# Patient Record
Sex: Female | Born: 1940 | Race: White | Hispanic: No | Smoking: Never smoker
Health system: Southern US, Community
[De-identification: ages and names within clinical notes are randomized; demographics above are authoritative.]

## PROBLEM LIST (undated history)

## (undated) DIAGNOSIS — R112 Nausea with vomiting, unspecified: Secondary | ICD-10-CM

## (undated) DIAGNOSIS — M797 Fibromyalgia: Secondary | ICD-10-CM

## (undated) DIAGNOSIS — N200 Calculus of kidney: Secondary | ICD-10-CM

## (undated) DIAGNOSIS — G43009 Migraine without aura, not intractable, without status migrainosus: Secondary | ICD-10-CM

## (undated) DIAGNOSIS — E669 Obesity, unspecified: Secondary | ICD-10-CM

## (undated) DIAGNOSIS — H409 Unspecified glaucoma: Secondary | ICD-10-CM

## (undated) DIAGNOSIS — I10 Essential (primary) hypertension: Secondary | ICD-10-CM

## (undated) DIAGNOSIS — S329XXA Fracture of unspecified parts of lumbosacral spine and pelvis, initial encounter for closed fracture: Secondary | ICD-10-CM

## (undated) DIAGNOSIS — M1990 Unspecified osteoarthritis, unspecified site: Secondary | ICD-10-CM

## (undated) DIAGNOSIS — G250 Essential tremor: Secondary | ICD-10-CM

## (undated) DIAGNOSIS — G252 Other specified forms of tremor: Secondary | ICD-10-CM

## (undated) DIAGNOSIS — Z9889 Other specified postprocedural states: Secondary | ICD-10-CM

## (undated) DIAGNOSIS — T8859XA Other complications of anesthesia, initial encounter: Secondary | ICD-10-CM

## (undated) DIAGNOSIS — E039 Hypothyroidism, unspecified: Secondary | ICD-10-CM

## (undated) DIAGNOSIS — G43909 Migraine, unspecified, not intractable, without status migrainosus: Secondary | ICD-10-CM

## (undated) DIAGNOSIS — T4145XA Adverse effect of unspecified anesthetic, initial encounter: Secondary | ICD-10-CM

## (undated) DIAGNOSIS — R002 Palpitations: Secondary | ICD-10-CM

## (undated) DIAGNOSIS — F32A Depression, unspecified: Secondary | ICD-10-CM

## (undated) DIAGNOSIS — E785 Hyperlipidemia, unspecified: Secondary | ICD-10-CM

## (undated) DIAGNOSIS — F329 Major depressive disorder, single episode, unspecified: Secondary | ICD-10-CM

## (undated) HISTORY — DX: Migraine without aura, not intractable, without status migrainosus: G43.009

## (undated) HISTORY — DX: Other specified forms of tremor: G25.2

## (undated) HISTORY — DX: Essential tremor: G25.0

## (undated) HISTORY — DX: Unspecified osteoarthritis, unspecified site: M19.90

## (undated) HISTORY — PX: LAPAROSCOPIC HYSTERECTOMY: SHX1926

## (undated) HISTORY — DX: Calculus of kidney: N20.0

## (undated) HISTORY — DX: Obesity, unspecified: E66.9

## (undated) HISTORY — PX: NASAL SEPTUM SURGERY: SHX37

## (undated) HISTORY — DX: Hypothyroidism, unspecified: E03.9

## (undated) HISTORY — DX: Palpitations: R00.2

## (undated) HISTORY — DX: Major depressive disorder, single episode, unspecified: F32.9

## (undated) HISTORY — DX: Essential (primary) hypertension: I10

## (undated) HISTORY — DX: Migraine, unspecified, not intractable, without status migrainosus: G43.909

## (undated) HISTORY — DX: Fibromyalgia: M79.7

## (undated) HISTORY — DX: Depression, unspecified: F32.A

## (undated) HISTORY — DX: Hyperlipidemia, unspecified: E78.5

## (undated) HISTORY — DX: Fracture of unspecified parts of lumbosacral spine and pelvis, initial encounter for closed fracture: S32.9XXA

## (undated) HISTORY — PX: OTHER SURGICAL HISTORY: SHX169

---

## 1982-12-13 HISTORY — PX: VESICOVAGINAL FISTULA CLOSURE W/ TAH: SUR271

## 1998-07-02 ENCOUNTER — Ambulatory Visit (HOSPITAL_COMMUNITY): Admission: RE | Admit: 1998-07-02 | Discharge: 1998-07-02 | Payer: Self-pay | Admitting: Endocrinology

## 1998-09-12 ENCOUNTER — Encounter: Payer: Self-pay | Admitting: Sports Medicine

## 1998-09-12 ENCOUNTER — Ambulatory Visit (HOSPITAL_COMMUNITY): Admission: RE | Admit: 1998-09-12 | Discharge: 1998-09-12 | Payer: Self-pay | Admitting: Sports Medicine

## 1998-09-17 ENCOUNTER — Encounter: Admission: RE | Admit: 1998-09-17 | Discharge: 1998-11-25 | Payer: Self-pay | Admitting: Sports Medicine

## 1998-09-22 ENCOUNTER — Encounter: Admission: RE | Admit: 1998-09-22 | Discharge: 1998-12-21 | Payer: Self-pay | Admitting: Endocrinology

## 1998-11-19 ENCOUNTER — Ambulatory Visit (HOSPITAL_COMMUNITY): Admission: RE | Admit: 1998-11-19 | Discharge: 1998-11-19 | Payer: Self-pay | Admitting: Gastroenterology

## 1998-11-21 ENCOUNTER — Encounter: Payer: Self-pay | Admitting: Gastroenterology

## 1998-11-21 ENCOUNTER — Ambulatory Visit (HOSPITAL_COMMUNITY): Admission: RE | Admit: 1998-11-21 | Discharge: 1998-11-21 | Payer: Self-pay | Admitting: Gastroenterology

## 1999-01-01 ENCOUNTER — Encounter: Admission: RE | Admit: 1999-01-01 | Discharge: 1999-04-01 | Payer: Self-pay | Admitting: Endocrinology

## 1999-04-13 ENCOUNTER — Encounter: Admission: RE | Admit: 1999-04-13 | Discharge: 1999-07-12 | Payer: Self-pay | Admitting: Endocrinology

## 1999-06-03 ENCOUNTER — Emergency Department (HOSPITAL_COMMUNITY): Admission: EM | Admit: 1999-06-03 | Discharge: 1999-06-03 | Payer: Self-pay | Admitting: Emergency Medicine

## 1999-06-03 ENCOUNTER — Encounter: Payer: Self-pay | Admitting: Emergency Medicine

## 2000-04-25 ENCOUNTER — Ambulatory Visit (HOSPITAL_COMMUNITY): Admission: RE | Admit: 2000-04-25 | Discharge: 2000-04-25 | Payer: Self-pay | Admitting: Endocrinology

## 2000-04-25 ENCOUNTER — Encounter: Payer: Self-pay | Admitting: Endocrinology

## 2000-05-12 ENCOUNTER — Ambulatory Visit (HOSPITAL_COMMUNITY): Admission: RE | Admit: 2000-05-12 | Discharge: 2000-05-12 | Payer: Self-pay | Admitting: Orthopedic Surgery

## 2000-05-12 ENCOUNTER — Encounter: Payer: Self-pay | Admitting: Orthopedic Surgery

## 2000-07-20 ENCOUNTER — Ambulatory Visit (HOSPITAL_COMMUNITY): Admission: RE | Admit: 2000-07-20 | Discharge: 2000-07-20 | Payer: Self-pay | Admitting: Neurology

## 2000-07-20 ENCOUNTER — Encounter: Payer: Self-pay | Admitting: Neurology

## 2000-07-26 ENCOUNTER — Encounter: Admission: RE | Admit: 2000-07-26 | Discharge: 2000-10-24 | Payer: Self-pay | Admitting: Endocrinology

## 2001-06-19 ENCOUNTER — Other Ambulatory Visit: Admission: RE | Admit: 2001-06-19 | Discharge: 2001-06-19 | Payer: Self-pay | Admitting: Obstetrics and Gynecology

## 2001-08-02 ENCOUNTER — Encounter: Payer: Self-pay | Admitting: Obstetrics and Gynecology

## 2001-08-02 ENCOUNTER — Encounter: Admission: RE | Admit: 2001-08-02 | Discharge: 2001-08-02 | Payer: Self-pay | Admitting: Obstetrics and Gynecology

## 2002-03-01 ENCOUNTER — Ambulatory Visit (HOSPITAL_COMMUNITY): Admission: RE | Admit: 2002-03-01 | Discharge: 2002-03-01 | Payer: Self-pay | Admitting: Infectious Diseases

## 2002-03-31 ENCOUNTER — Ambulatory Visit (HOSPITAL_COMMUNITY): Admission: RE | Admit: 2002-03-31 | Discharge: 2002-03-31 | Payer: Self-pay | Admitting: Rheumatology

## 2002-03-31 ENCOUNTER — Encounter: Payer: Self-pay | Admitting: Rheumatology

## 2002-08-09 ENCOUNTER — Other Ambulatory Visit: Admission: RE | Admit: 2002-08-09 | Discharge: 2002-08-09 | Payer: Self-pay | Admitting: Obstetrics and Gynecology

## 2002-10-05 ENCOUNTER — Encounter: Admission: RE | Admit: 2002-10-05 | Discharge: 2002-10-05 | Payer: Self-pay | Admitting: Infectious Diseases

## 2003-05-01 ENCOUNTER — Emergency Department (HOSPITAL_COMMUNITY): Admission: EM | Admit: 2003-05-01 | Discharge: 2003-05-01 | Payer: Self-pay | Admitting: Emergency Medicine

## 2003-05-01 ENCOUNTER — Encounter: Payer: Self-pay | Admitting: Emergency Medicine

## 2006-07-01 ENCOUNTER — Other Ambulatory Visit: Admission: RE | Admit: 2006-07-01 | Discharge: 2006-07-01 | Payer: Self-pay | Admitting: Gynecology

## 2006-07-19 ENCOUNTER — Encounter: Admission: RE | Admit: 2006-07-19 | Discharge: 2006-07-19 | Payer: Self-pay | Admitting: Endocrinology

## 2006-10-31 ENCOUNTER — Ambulatory Visit (HOSPITAL_COMMUNITY): Admission: RE | Admit: 2006-10-31 | Discharge: 2006-10-31 | Payer: Self-pay | Admitting: Neurology

## 2010-04-27 ENCOUNTER — Encounter: Payer: Self-pay | Admitting: Internal Medicine

## 2010-08-24 ENCOUNTER — Telehealth (INDEPENDENT_AMBULATORY_CARE_PROVIDER_SITE_OTHER): Payer: Self-pay | Admitting: *Deleted

## 2010-08-31 ENCOUNTER — Ambulatory Visit: Payer: Self-pay | Admitting: Internal Medicine

## 2010-08-31 DIAGNOSIS — I10 Essential (primary) hypertension: Secondary | ICD-10-CM | POA: Insufficient documentation

## 2010-08-31 DIAGNOSIS — E785 Hyperlipidemia, unspecified: Secondary | ICD-10-CM

## 2010-08-31 DIAGNOSIS — J3089 Other allergic rhinitis: Secondary | ICD-10-CM

## 2010-08-31 DIAGNOSIS — E039 Hypothyroidism, unspecified: Secondary | ICD-10-CM | POA: Insufficient documentation

## 2010-08-31 DIAGNOSIS — J4520 Mild intermittent asthma, uncomplicated: Secondary | ICD-10-CM

## 2010-08-31 DIAGNOSIS — J302 Other seasonal allergic rhinitis: Secondary | ICD-10-CM

## 2010-09-01 ENCOUNTER — Ambulatory Visit: Payer: Self-pay | Admitting: Internal Medicine

## 2010-09-09 ENCOUNTER — Telehealth (INDEPENDENT_AMBULATORY_CARE_PROVIDER_SITE_OTHER): Payer: Self-pay | Admitting: *Deleted

## 2010-09-29 ENCOUNTER — Ambulatory Visit: Payer: Self-pay | Admitting: Internal Medicine

## 2010-10-20 ENCOUNTER — Telehealth: Payer: Self-pay | Admitting: Internal Medicine

## 2010-10-20 DIAGNOSIS — J209 Acute bronchitis, unspecified: Secondary | ICD-10-CM | POA: Insufficient documentation

## 2011-01-12 NOTE — Progress Notes (Signed)
Summary: NF:AOZH  Phone Note Call from Patient Call back at [phone]   Caller: Patient Call For: young Reason for Call: Talk to Nurse Summary of Call: pt returning call to [facility].  Will be a t number given until 1:30pm Initial call taken by: Eugene Gavia,  August 24, 2010 8:23 AM  Follow-up for Phone Call        Florentina Addison, did you call this pt? She has upcoming appt sched with CDY for Sept 19th, but I see no documentation where you called.  Pls advise thank Vernie Murders  August 24, 2010 10:13 AM   Additional Follow-up for Phone Call Additional follow up Details #1::        I have already spoke to patient this am and scheduled that appt for her; she had orginal appt for 09-15-2010.Reynaldo Minium CMA  August 24, 2010 10:30 AM

## 2011-01-12 NOTE — Progress Notes (Signed)
Summary: returning call > cxr results  Phone Note Call from Patient   Caller: Patient Call For: young Summary of Call: Returning Katie's call. Initial call taken by: Darletta Moll,  September 09, 2010 4:24 PM  Follow-up for Phone Call        called spoke with patient, advised of cxr results as stated by CDY in append to 9.20.11 cxr.  pt verbalized her understanding. Boone Master CNA/MA  September 09, 2010 4:27 PM

## 2011-01-12 NOTE — Assessment & Plan Note (Signed)
Summary: rov 1 month///kp   Primary Provider/Referring Provider:  [facility]  CC:  1 month follow up visit-asthma; not using inhalers or nebs as often now and doing great with Singulair.Marland Kitchen  History of Present Illness: August 31, 2010- 69 yoF never smoker, [facility] [employer]. Old GCDA patient seeking to restablish because of persistent cough. As she reconstructs now, she began coughing with initiation of Lisinopril from [facility] cardiology about 5 weeks ago and was promptly changed to Losartan. Off lisnopril now over a month, but continues dry cough. Feels weak and hoarse. Yesterday had some chilling but no definite fever. Maybe some sore throat. Cough is exhausting, worse if postnasal drip. Denies headache, purulent discharge. Chest tightness eased temporarily by Xopenex HFA or xopenex neb. Added samples Singulair till she could get this appt. Past hx asthma treated by me and by Dr Campbell Callas in the past. Albuterol was never tolerated- nervousness and tremor. All codeine/ hydrocodone nauseate her.  Medcal hx of obesity, hypertension, hypothyroid, palpitations, Non IDDM II.  Has gained weight in last 10 years. Notes marked dyspnea on stairs. Usually not wheezing prior to acute illnes, now some wheeze. BP meds adjusted by cardiology, with edema related to CCB. Dr Evlyn Kanner treats thyroid.  September 29, 2010- Asthma, allergic rhinitis She needed several weeks to finally get over the bronchitis and laryngitis that she had last time. She feels that Singulair made the difference, although the cough began around the time she was placed on an ACE inhibitor.Marland Kitchen Has not been needing inhalers at all in last 2 weeks. She had felt tight before she began an ACE inhibitor. Now on amlodipine, Losartan and as needed furosemide. Very occasional nasal drip and sneeze. Takes as needed zyrtec or loratadine.  Had red local reaction to her last pneumovax- I have suggested she not repeat it..  Ears itch chronically.   Asthma History    Asthma  Control Assessment:    Age range: 12+ years    Symptoms: 0-2 days/week    Nighttime Awakenings: 0-2/month    Interferes w/ normal activity: no limitations    SABA use (not for EIB): 0-2 days/week    Asthma Control Assessment: Well Controlled   Preventive Screening-Counseling & Management  Alcohol-Tobacco     Smoking Status: never  Current Medications (verified): 1)  Amlodipine Besylate 5 Mg Tabs (Amlodipine Besylate) .Marland Kitchen.. 1 Once Daily 2)  Furosemide 40 Mg Tabs (Furosemide) .... 1/2 To 1 Tablet As Needed 3)  Lorazepam 1 Mg Tabs (Lorazepam) .Marland Kitchen.. 1 Two Times A Day As Needed 4)  Xopenex 1.25 Mg/67ml Nebu (Levalbuterol Hcl) .Marland Kitchen.. 1 Two Times A Day 5)  Zyrtec Allergy 10 Mg Tabs (Cetirizine Hcl) .Marland Kitchen.. 1 Once Daily As Needed 6)  Xopenex Hfa 45 Mcg/act Aero (Levalbuterol Tartrate) .... 2 Puffs Four Times A Day As Needed 7)  Singulair 10 Mg Tabs (Montelukast Sodium) .Marland Kitchen.. 1 Daily 8)  Losartan Potassium 50 Mg Tabs (Losartan Potassium) .... Take 1 By Mouth Once Daily 9)  Synthroid 112 Mcg Tabs (Levothyroxine Sodium) .... Take 1 By Mouth Once Daily Except On Sundays Take 1/2 By Mouth.  Allergies (verified): 1)  ! Sulfa 2)  ! * Tramadol 3)  ! * Ethromycin 4)  ! Codeine 5)  ! * Pna Vaccine  Past History:  Past Medical History: Last updated: 08/31/2010 Allergic Rhinitis Asthma Hypertension Bronchiectasis Diabetes, Type 2 Hyperlipidemia Hypothyroidism Palpitation Obesity  Family History: Last updated: 08/31/2010 Heart Disease Brother Heart Disease Mother Heart Disease Sister Asthma Mother deceased Mother Kidney Cancer  deceased  Social History: Last updated: 08/31/2010 Divorced, no children Works full time clinical lab tech Patient never smoked.  Lives alone- apartment, no pets  Risk Factors: Smoking Status: never (09/29/2010)  Past Surgical History: Hysyterectomy 1984 Gyn laparoscopy Nasal septoplasty  Review of Systems      See HPI       The patient complains of  shortness of breath with activity.  The patient denies shortness of breath at rest, productive cough, non-productive cough, coughing up blood, chest pain, irregular heartbeats, acid heartburn, indigestion, loss of appetite, weight change, abdominal pain, difficulty swallowing, sore throat, tooth/dental problems, headaches, nasal congestion/difficulty breathing through nose, sneezing, itching, rash, change in color of mucus, and fever.    Vital Signs:  Patient profile:   70 year old female Height:      61 inches Weight:      211.38 pounds BMI:     40.08 O2 Sat:      95 % on Room air Pulse rate:   79 / minute BP sitting:   124 / 74  (left arm) Cuff size:   large  Vitals Entered By: Reynaldo Minium CMA (September 29, 2010 2:11 PM)  O2 Flow:  Room air CC: 1 month follow up visit-asthma; not using inhalers or nebs as often now and doing great with Singulair.   Physical Exam  Additional Exam:  General: A/Ox3; pleasant and cooperative, NAD, SKIN: no rash, lesions NODES: no lymphadenopathy HEENT: [place]/AT, EOM- WNL, Conjuctivae- clear, PERRLA, Ears- External canals scaling thickening left > right, excoriated , Nose- clear, Throat-  looks normal and vpice normal,  no stridor. Mallampati  III NECK: Supple w/ fair ROM, JVD- none, normal carotid impulses w/o bruits Thyroid- normal to palpation CHEST: Clear to P&A, upper airway cough/ rattle. No wheeze or dullness. HEART: RRR, no m/g/r heard ABDOMEN: Overweight ZOX:WRUE, nl pulses, no edema now. NEURO: Grossly intact to observation      Impression & Recommendations:  Problem # 1:  ASTHMA (ICD-493.90) Bronchitis has resolved and she is not actively wheezing. We discussed singulair. She wants to continue it for now.  Problem # 2:  ALLERGIC RHINITIS (ICD-477.9) Singulair may also help her rhinitis, which is more seasonal. She has otc antihistamines if needed.  Her updated medication list for this problem includes:    Zyrtec Allergy 10 Mg Tabs  (Cetirizine hcl) .Marland Kitchen... 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 Will try topical therapy, but she may need ENT or Dermatology.  Her updated medication list for this problem includes:    Neomycin-polymyxin-hc 3.5-10000-1 Susp (Neomycin-polymyxin-hc) .Marland Kitchen... 2-3 drops in ear two times a day  Medications Added to Medication List This Visit: 1)  Losartan Potassium 50 Mg Tabs (Losartan potassium) .... Take 1 by mouth once daily 2)  Synthroid 112 Mcg Tabs (Levothyroxine sodium) .... Take 1 by mouth once daily except on sundays take 1/2 by mouth. 3)  Neomycin-polymyxin-hc 3.5-10000-1 Susp (Neomycin-polymyxin-hc) .... 2-3 drops in ear two times a day  Other Orders: Est. Patient Level III (45409)  Patient Instructions: 1)  Please schedule a follow-up appointment in 4 months. 2)  I will check on ear drops for fungal ear infection to see if that might help.  Prescriptions: NEOMYCIN-POLYMYXIN-HC 3.5-10000-1 SUSP (NEOMYCIN-POLYMYXIN-HC) 2-3 drops in ear two times a day  #1 vial x 1   Entered and Authorized by:   Waymon Budge MD   Signed by:  Waymon Budge MD on 09/30/2010   Method used:   Electronically to        [facility]  [address] [phone]* (retail)       [address]       [place], [place]  [address]       Ph: [phone] or [phone]       Fax: [phone]   RxID:   [id number]

## 2011-01-12 NOTE — Assessment & Plan Note (Signed)
Summary: Re-establish pt/asthma and allergies/kcw   Primary Provider/Referring Provider:  [facility]  CC:  Pulmonary Consult.  History of Present Illness: August 31, 2010- 69 yoF never smoker, [facility] [employer]. Old GCDA patient seeking to restablish because of persistent cough. As she reconstructs now, she began coughing with initiation of Lisinopril from [facility] cardiology about 5 weeks ago and was promptly changed to Losartan. Off lisnopril now over a month, but continues dry cough. Feels weak and hoarse. Yesterday had some chilling but no definite fever. Maybe some sore throat. Cough is exhausting, worse if postnasal drip. Denies headache, purulent discharge. Chest tightness eased temporarily by Xopenex HFA or xopenex neb. Added samples Singulair till she could get this appt. Past hx asthma treated by me and by Dr Dola Callas in the past. Albuterol was never tolerated- nervousness and tremor. All codeine/ hydrocodone nauseate her.  Medcal hx of obesity, hypertension, hypothyroid, palpitations, Non IDDM II.  Has gained weight in last 10 years. Notes marked dyspnea on stairs. Usually not wheezing prior to acute illnes, now some wheeze. BP meds adjusted by cardiology, with edema related to CCB. Dr Evlyn Kanner treats thyroid and DM.  Asthma History    Initial Asthma Severity Rating:    Age range: 12+ years    Symptoms: daily    Nighttime Awakenings: 0-2/month    Interferes w/ normal activity: some limitations    SABA use (not for EIB): daily    Asthma Severity Assessment: Moderate Persistent              Preventive Screening-Counseling & Management  Alcohol-Tobacco     Smoking Status: never  Past History:  Past Medical History: Allergic Rhinitis Asthma Hypertension Bronchiectasis Diabetes, Type 2 Hyperlipidemia Hypothyroidism Palpitation Obesity  Past Surgical History: Hysyterectomy 1984  Family History: Heart Disease Brother Heart Disease Mother Heart Disease Sister Asthma  Mother deceased Mother Kidney Cancer deceased  Social History: Divorced, no children 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  Lives alone- apartment, no pets Smoking Status:  never  Review of Systems       The patient complains of shortness of breath with activity, non-productive cough, sore throat, joint stiffness or pain, irregular heartbeats, headaches, nasal congestion/difficulty breathing through nose, ear ache, anxiety, depression, and hand/feet swelling.  The patient denies chest pain, acid heartburn, indigestion, loss of appetite, weight change, abdominal pain, difficulty swallowing, tooth/dental problems, sneezing, itching, rash, change in color of mucus, and fever.    Vital Signs:  Patient profile:   70 year old female Height:      61 inches Weight:      207.8 pounds BMI:     39.41 O2 Sat:      93 % on Room air Pulse rate:   97 / minute BP sitting:   120 / 66  (left arm)  Vitals Entered By: Renold Genta RCP, LPN (August 31, 2010 4:47 PM)  O2 Flow:  Room air CC: Pulmonary Consult Comments Medications reviewed with patient Renold Genta RCP, LPN  August 31, 2010 4:48 PM    Physical Exam  Additional Exam:  General: A/Ox3; pleasant and cooperative, NAD, SKIN: no rash, lesions NODES: no lymphadenopathy HEENT: [place]/AT, EOM- WNL, Conjuctivae- clear, PERRLA, TM-WNL, Nose- clear, Throat- red, no exudate or drainage. Hoarse/ weak voiced, no stridor. Mallampati  III NECK: Supple w/ fair ROM, JVD- none, normal carotid impulses w/o bruits Thyroid- normal to palpation CHEST: Clear to P&A, upper airway cough/ rattle. No wheeze or dullness. HEART: RRR, no m/g/r heard  ABDOMEN: Soft and nl; nml bowel sounds; no organomegaly or masses noted, obese XBJ:YNWG, nl pulses, no edema now. NEURO: Grossly intact to observation      Impression & Recommendations:  Problem # 1:  ASTHMA (ICD-493.90) Asthmatic bronchtis with laryngitis. This may have begun as a  lisinopril ACEI problem, but she stopped a month ago., Losartan is less likely to be a cause of cough. We discussed asthma- noting that she is eased by using her asthma meds. She doesn't tolerate albutrol due to nervousness and palpitation. We will give scripts for Xopenex and Singulair.  Try tramadol for cough suppression. If this is now a cyclical cough, interrupting it may help;.Discussed the strong possibility that she had a viral head and chest cold in the middle of this. Watching for possibility of sinusitis.  We will get CXR and consider CT sinus.  Medications Added to Medication List This Visit: 1)  Amlodipine Besylate 5 Mg Tabs (Amlodipine besylate) .Marland Kitchen.. 1 once daily 2)  Furosemide 40 Mg Tabs (Furosemide) .... 1/2 to 1 tablet as needed 3)  Lorazepam 1 Mg Tabs (Lorazepam) .Marland Kitchen.. 1 two times a day as needed 4)  Xopenex 1.25 Mg/40ml Nebu (Levalbuterol hcl) .Marland Kitchen.. 1 two times a day 5)  Singulair 10 Mg Tabs (Montelukast sodium) .Marland Kitchen.. 1 once daily 6)  Zyrtec Allergy 10 Mg Tabs (Cetirizine hcl) .Marland Kitchen.. 1 once daily as needed 7)  Xopenex Hfa 45 Mcg/act Aero (Levalbuterol tartrate) .... 2 puffs four times a day as needed 8)  Singulair 10 Mg Tabs (Montelukast sodium) .Marland Kitchen.. 1 daily 9)  Tramadol Hcl 50 Mg Tabs (Tramadol hcl) .Marland Kitchen.. 1 three times a day as needed  Other Orders: New Patient Level III (95621) T-2 View CXR (71020TC)  Patient Instructions: 1)  Please schedule a follow-up appointment in 1 month. 2)  A chest x-ray has been recommended.  Your imaging study may require preauthorization.  3)  Try script tramadol for cough/ you can combine it with an otc cough syrup to soothe your throat if that helps. 4)  Refill scripts for xopenex HFA, singulair 5)  Voice rest, fluids. Pay attention to anything that makes you think you might have sinus infection ( headache, pressure, stuffiness, drainage) Prescriptions: TRAMADOL HCL 50 MG TABS (TRAMADOL HCL) 1 three times a day as needed  #10 x 0   Entered and  Authorized by:   Waymon Budge MD   Signed by:   Waymon Budge MD on 08/31/2010   Method used:   Print then Give to Patient   RxID:   [id number] SINGULAIR 10 MG TABS (MONTELUKAST SODIUM) 1 daily  #30 x prn   Entered and Authorized by:   Waymon Budge MD   Signed by:   Waymon Budge MD on 08/31/2010   Method used:   Print then Give to Patient   RxID:   [id number] HFA 45 MCG/ACT AERO (LEVALBUTEROL TARTRATE) 2 puffs four times a day as needed  #1 x prn   Entered and Authorized by:   Waymon Budge MD   Signed by:   Waymon Budge MD on 08/31/2010   Method used:   Print then Give to Patient   RxID:   [id number]

## 2011-01-12 NOTE — Progress Notes (Signed)
Summary: Correction of problem list  Phone Note Call from Patient   Summary of Call: Patient called today after reviewing my note with Dr Evlyn Kanner and making corrections. Never dx'd diabetes. Acute bronchitis, not bronchiectasis .I thanked her for corrections.   New Problems: ACUTE BRONCHITIS (ICD-466.0)   New Problems: ACUTE BRONCHITIS (ICD-466.0)

## 2011-01-28 ENCOUNTER — Ambulatory Visit (INDEPENDENT_AMBULATORY_CARE_PROVIDER_SITE_OTHER): Payer: 59 | Admitting: Internal Medicine

## 2011-01-28 ENCOUNTER — Encounter: Payer: Self-pay | Admitting: Internal Medicine

## 2011-01-28 DIAGNOSIS — J45909 Unspecified asthma, uncomplicated: Secondary | ICD-10-CM

## 2011-01-28 DIAGNOSIS — B369 Superficial mycosis, unspecified: Secondary | ICD-10-CM

## 2011-01-28 DIAGNOSIS — J309 Allergic rhinitis, unspecified: Secondary | ICD-10-CM

## 2011-01-31 DIAGNOSIS — L219 Seborrheic dermatitis, unspecified: Secondary | ICD-10-CM | POA: Insufficient documentation

## 2011-02-09 NOTE — Assessment & Plan Note (Signed)
Summary: rov 4 months//kp   Primary Provider/Referring Provider:  [facility]  CC:  4 month follow up visit-asthma and allergies..  History of Present Illness: September 29, 2010- Asthma, allergic rhinitis She needed several weeks to finally get over the bronchitis and laryngitis that she had last time. She feels that Singulair made the difference, although the cough began around the time she was placed on an ACE inhibitor.Marland Kitchen Has not been needing inhalers at all in last 2 weeks. She had felt tight before she began an ACE inhibitor. Now on amlodipine, Losartan and as needed furosemide. Very occasional nasal drip and sneeze. Takes as needed zyrtec or loratadine.  Had red local reaction to her last pneumovax- I have suggested she not repeat it..  Ears itch chronically.   January 28, 2011-  Asthma, allergic rhinitis, seborrheic dermatitis/ears Nurse-CC: 4 month follow up visit-asthma and allergies. Singulair has made a major difference for cough and shortness of breath and well tolerated. Has Xopenex, but rarely needed.  Some sneeze episodes, taking antihistamine as needed.  She has been told her heart "double beats" and she is being followed by Dr Evlyn Kanner and Dr Sandria Manly. Denies chest pain, palpitation, syncope. IItching scaling process in ear canals got much better with neomycin ear drop - probably was a seborrheic dermatititis.    Preventive Screening-Counseling & Management  Alcohol-Tobacco     Smoking Status: never  Current Medications (verified): 1)  Amlodipine Besylate 5 Mg Tabs (Amlodipine Besylate) .Marland Kitchen.. 1 Once Daily 2)  Furosemide 40 Mg Tabs (Furosemide) .... Take 1 By Mouth Once Daily 3)  Lorazepam 1 Mg Tabs (Lorazepam) .Marland Kitchen.. 1 Two Times A Day As Needed 4)  Xopenex 1.25 Mg/71ml Nebu (Levalbuterol Hcl) .Marland Kitchen.. 1 Two Times A Day As Needed 5)  Zyrtec Allergy 10 Mg Tabs (Cetirizine Hcl) .Marland Kitchen.. 1 Once Daily As Needed 6)  Xopenex Hfa 45 Mcg/act Aero (Levalbuterol Tartrate) .... 2 Puffs Four Times A Day As  Needed 7)  Singulair 10 Mg Tabs (Montelukast Sodium) .Marland Kitchen.. 1 Daily 8)  Losartan Potassium 50 Mg Tabs (Losartan Potassium) .... Take 1 By Mouth Once Daily 9)  Synthroid 112 Mcg Tabs (Levothyroxine Sodium) .... Take 1 By Mouth Once Daily Except On Sundays Take 1/2 By Mouth. 10)  Neomycin-Polymyxin-Hc 3.5-10000-1 Susp (Neomycin-Polymyxin-Hc) .... 2-3 Drops in Ear Two Times A Day 11)  Vitamin D3 50000 Unit Caps (Cholecalciferol) .... Take 2 By Mouth Per Week 12)  Calcium 600 Mg Tabs (Calcium) .... Take 2 By Mouth Once Daily  Allergies (verified): 1)  ! Sulfa 2)  ! * Tramadol 3)  ! * Ethromycin 4)  ! Codeine 5)  ! * Pna Vaccine  Past History:  Past Medical History: Allergic Rhinitis Asthma Hypertension Bronchiectasis Hyperlipidemia Hypothyroidism Palpitation Obesity  Vital Signs:  Patient profile:   69 year old female Height:      61 inches Weight:      201.50 pounds BMI:     38.21 O2 Sat:      96  % on Room air Pulse rate:   76 / minute BP sitting:   136 / 76  (left arm) Cuff size:   regular  Vitals Entered By: Reynaldo Minium CMA (January 28, 2011 2:49 PM)  O2 Flow:  Room air CC: 4 month follow up visit-asthma and allergies.   Physical Exam  Additional Exam:  General: A/Ox3; pleasant and cooperative, NAD, SKIN: no rash, lesions NODES: no lymphadenopathy HEENT: [place]/AT, EOM- WNL, Conjuctivae- clear, PERRLA, Ears-caanbals are clearer , Nose- clear, Throat-  looks normal and voice normal,  no stridor. Mallampati  III NECK: Supple w/ fair ROM, JVD- none, normal carotid impulses w/o bruits Thyroid- normal to palpation CHEST: Clear to P&A, upper airway cough/ rattle. No wheeze or dullness. HEART: RRR, no m/g/r heard ABDOMEN: Overweight OZH:YQMV, nl pulses, no edema now. NEURO: Grossly intact to observation      Impression & Recommendations:  Problem # 1:  ALLERGIC RHINITIS (ICD-477.9) She is not interested in extending med list. Occasional otc antihistamine should be  sufficient.  Her updated medication list for this problem includes:    Zyrtec Allergy 10 Mg Tabs (Cetirizine hcl) .Marland Kitchen... 1 once daily as needed  Problem # 2:  ASTHMA (ICD-493.90) Singulair has mostly been sufficient. She did note increased cough and some chest tightness when she tried too quit it, and has since resumed.   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 Take 1 by mouth once daily 2)  Xopenex 1.25 Mg/79ml Nebu (Levalbuterol hcl) .Marland Kitchen.. 1 two times a day as needed 3)  Vitamin D3 50000 Unit Caps (Cholecalciferol) .... Take 2 by mouth per week 4)  Calcium 600 Mg Tabs (Calcium) .... Take 2 by mouth once daily  Other Orders: Est. Patient Level III (78469)  Patient Instructions: 1)  Please schedule a follow-up appointment in 6 months. Please call sooner if needed. 2)  Ok to continue Singulair and to use Xopenex neb if needed.

## 2011-07-28 ENCOUNTER — Encounter: Payer: Self-pay | Admitting: Internal Medicine

## 2011-07-29 ENCOUNTER — Encounter: Payer: Self-pay | Admitting: *Deleted

## 2011-07-29 ENCOUNTER — Ambulatory Visit (INDEPENDENT_AMBULATORY_CARE_PROVIDER_SITE_OTHER): Payer: 59 | Admitting: Internal Medicine

## 2011-07-29 ENCOUNTER — Encounter: Payer: Self-pay | Admitting: Internal Medicine

## 2011-07-29 VITALS — BP 110/64 | HR 82 | Ht 61.0 in | Wt 211.4 lb

## 2011-07-29 DIAGNOSIS — J309 Allergic rhinitis, unspecified: Secondary | ICD-10-CM

## 2011-07-29 DIAGNOSIS — J45909 Unspecified asthma, uncomplicated: Secondary | ICD-10-CM

## 2011-07-29 MED ORDER — LEVALBUTEROL HCL 1.25 MG/3ML IN NEBU: 1.0000 | INHALATION_SOLUTION | Freq: Two times a day (BID) | RESPIRATORY_TRACT | Status: DC | PRN

## 2011-07-29 MED ORDER — MONTELUKAST SODIUM 10 MG PO TABS: 10.0000 mg | ORAL_TABLET | Freq: Every day | ORAL | Status: DC

## 2011-07-29 MED ORDER — LEVALBUTEROL TARTRATE 45 MCG/ACT IN AERO: 2.0000 | INHALATION_SPRAY | Freq: Four times a day (QID) | RESPIRATORY_TRACT | Status: DC | PRN

## 2011-07-29 NOTE — Patient Instructions (Signed)
Meds refilled  - please call if needed sooner  Work note for today

## 2011-07-29 NOTE — Assessment & Plan Note (Signed)
Reviewed antihistamine options

## 2011-07-29 NOTE — Progress Notes (Signed)
Subjective:    Patient ID: Brandy Oconnell, female    DOB: 1941-03-12, 70 y.o.   MRN: [id number]  HPI 07/29/11-60 year old never smoker followed for asthma, Allergic rhinitis, bronchitis Last here January 28, 2011 -note reviewed Noticing some sneezing in last few days- seasonal. Asthmatic bronchitis has been significantly improved by resumption of Singulair. Rescue inhaler is not needed often, less than once per week. Continues aware of palpitation without change and without chest pain or syncope. 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 Ankle edema improved significantly as Norvasc dose reduced. Review of Systems Constitutional:   No-   weight loss, night sweats, fevers, chills,               + fatigue, lassitude. HEENT:   No-  headaches, difficulty swallowing, tooth/dental problems, sore throat,       +-  sneezing, itching, ear ache, nasal congestion, post nasal drip,  CV:  No-   chest pain, orthopnea, PND, swelling in lower extremities, anasarca, dizziness,             + palpitations Resp: + shortness of breath with exertion or at rest.              No-   productive cough,  No non-productive cough,  No-  coughing up of blood.              No-   change in color of mucus.  No- wheezing.   Skin: No-   rash or lesions. GI:  No-   heartburn, indigestion, abdominal pain, nausea, vomiting, diarrhea,                 change in bowel habits, loss of appetite GU: No-   dysuria, change in color of urine, no urgency or frequency.  No- flank pain. MS:  No-   joint pain or swelling.  No- decreased range of motion.  No- back pain. Neuro- grossly normal to observation, Or:  Psych:  No- change in mood or affect. No depression or anxiety.  No memory loss.      Objective:   Physical Exam General- Alert, Oriented, Affect-appropriate, Distress- none acute;  overweight Skin- rash-none, lesions- none,  excoriation- none Lymphadenopathy- none Head- atraumatic            Eyes- Gross vision intact, PERRLA, conjunctivae clear secretions            Ears- Hearing, canals scaling and dry wax- previous diagnosis of seborrheic dermatitis            Nose- Clear, No-Septal dev, mucus, polyps, erosion, perforation             Throat- Mallampati III , mucosa clear , drainage- none, tonsils- atrophic Neck- flexible , trachea midline, no stridor , thyroid nl, carotid no bruit Chest - symmetrical excursion , unlabored           Heart/CV- RRR , no murmur , no gallop , no rub, nl s1 s2                           - JVD- none , edema- none, stasis changes- none, varices- none           Lung- clear to P&A, wheeze- none, cough- none , dullness-none, rub- none           Chest wall-  Abd-  tender-no, distended-no, bowel sounds-present, HSM- no Br/ Gen/ Rectal- Not done, not indicated Extrem- cyanosis- none, clubbing, none, atrophy- none, strength- nl Neuro- grossly intact to observation        Assessment & Plan:

## 2011-07-29 NOTE — Assessment & Plan Note (Addendum)
For mild wheeze,  her rescue inhaler is usually adequate.  Sustained walking and aerobic exercise for weight loss and improved stamina are worse emphasizing.

## 2012-01-06 ENCOUNTER — Telehealth: Payer: Self-pay | Admitting: Internal Medicine

## 2012-01-06 MED ORDER — CEFDINIR 300 MG PO CAPS: 300.0000 mg | ORAL_CAPSULE | Freq: Two times a day (BID) | ORAL | Status: AC

## 2012-01-06 NOTE — Telephone Encounter (Signed)
I spoke with pt and she states her dentists advised her she has a sinus infection. Pt c/o pressure under eyes, hoarseness, nasal congestion, feels weak, chest tightness, chills, blowing out green mucus x Sunday. fever on Sunday and Monday of 101 but none today. Denies any nausea, vomiting, body aches, joint pain. Pt using her nebs and inhalers. Pt is requesting to have something called in. Please advise Dr. Maple Hudson, thanks  Allergies  Allergen Reactions  . Codeine   . Sulfonamide Derivatives   . Tramadol     REACTION: Nightmares    [facility]

## 2012-01-06 NOTE — Telephone Encounter (Signed)
Per CY-okay to give Cefdinir 300 mg #14 take 1 po bid no refills.  

## 2012-01-06 NOTE — Telephone Encounter (Signed)
I spoke with pt and is aware of cdy recs. Rx has been sent and pt is aware of this and the directions for rx. Nothing further was needed

## 2012-01-14 ENCOUNTER — Telehealth: Payer: Self-pay | Admitting: *Deleted

## 2012-01-14 MED ORDER — AMOXICILLIN 500 MG PO CAPS: 500.0000 mg | ORAL_CAPSULE | Freq: Three times a day (TID) | ORAL | Status: AC

## 2012-01-14 NOTE — Telephone Encounter (Signed)
I have sent Rx as requested.

## 2012-02-03 ENCOUNTER — Ambulatory Visit (INDEPENDENT_AMBULATORY_CARE_PROVIDER_SITE_OTHER): Payer: 59 | Admitting: Internal Medicine

## 2012-02-03 ENCOUNTER — Encounter: Payer: Self-pay | Admitting: Internal Medicine

## 2012-02-03 VITALS — BP 128/72 | HR 61 | Ht 61.0 in | Wt 216.2 lb

## 2012-02-03 DIAGNOSIS — J45909 Unspecified asthma, uncomplicated: Secondary | ICD-10-CM

## 2012-02-03 DIAGNOSIS — J309 Allergic rhinitis, unspecified: Secondary | ICD-10-CM

## 2012-02-03 DIAGNOSIS — J45998 Other asthma: Secondary | ICD-10-CM

## 2012-02-03 MED ORDER — FLUTICASONE PROPIONATE 50 MCG/ACT NA SUSP: 2.0000 | Freq: Every day | NASAL | Status: DC

## 2012-02-03 NOTE — Progress Notes (Signed)
Patient ID: Brandy Oconnell, female    DOB: [birth year], 71 y.o.   MRN: [id number]  HPI 07/29/11-69 year old never smoker followed for asthma, Allergic rhinitis, bronchitis Last here January 28, 2011 -note reviewed Noticing some sneezing in last few days- seasonal. Asthmatic bronchitis has been significantly improved by resumption of Singulair. Rescue inhaler is not needed often, less than once per week. Continues aware of palpitation without change and without chest pain or syncope. 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 Ankle edema improved significantly as Norvasc dose reduced.  02/03/12- 62 year old never smoker followed for asthma, Allergic rhinitis, bronchitis Nasal stuffiness. Sudafed caused palpitations. Allegra caused nausea. She prefers Zyrtec.. Does saline nasal rinse. Not wheezing now. Does have a nebulizer with Xopenex for use if needed.  Review of Systems- see HPI Constitutional:   No-   weight loss, night sweats, fevers, chills,  + fatigue, lassitude. HEENT:   No-  headaches, difficulty swallowing, tooth/dental problems, sore throat,       +-  sneezing, itching, ear ache, nasal congestion, post nasal drip,  CV:  No-   chest pain, orthopnea, PND, + Improved swelling in lower extremities, No- anasarca, dizziness,  + palpitations Resp: + shortness of breath with exertion or at rest.              No-   productive cough,  No non-productive cough,  No-  coughing up of blood.              No-   change in color of mucus.  No- wheezing.   Skin: No-   rash or lesions. GI:  No-   heartburn, indigestion, abdominal pain, nausea, vomiting, diarrhea,                 change in bowel habits, loss of appetite GU:  MS:  No- acute  joint pain or swelling. . Neuro- grossly normal to observation, Or:  Psych:  No- change in mood or affect. No depression or anxiety.  No memory loss.       Objective:   Physical Exam General- Alert, Oriented, Affect-appropriate, Distress- none acute;  overweight Skin- rash-none, lesions- none, excoriation- none Lymphadenopathy- none Head- atraumatic            Eyes- Gross vision intact, PERRLA, conjunctivae clear secretions            Ears- Hearing, canals scaling and dry wax- previous diagnosis of seborrheic dermatitis            Nose- Clear, No-Septal dev, mucus, polyps, erosion, perforation             Throat- Mallampati III , mucosa clear , drainage- none, tonsils- atrophic Neck- flexible , trachea midline, no stridor , thyroid nl, carotid no bruit Chest - symmetrical excursion , unlabored           Heart/CV- RRR , no murmur , no gallop , no rub, nl s1 s2                           - JVD- none , edema- none, stasis changes- none, varices- none           Lung- coarse breath sounds, unlabored, wheeze- mild, cough- none , dullness-none, rub- none           Chest wall-  Abd-  Br/ Gen/ Rectal- Not done,  not indicated Extrem- cyanosis- none, clubbing, none, atrophy- none, strength- nl Neuro- grossly intact to observation

## 2012-02-03 NOTE — Patient Instructions (Signed)
Flonase/ fluticasone nasal spray refill sent  Try otc decongestant phenylephrine ( Sudafed- PE) if you need a decongestant

## 2012-02-06 NOTE — Assessment & Plan Note (Addendum)
Seasonal allergic rhinitis. She will use her antihistamine and her nasal saline rinse. We'll watch for additional needs this spring. Suggested she try low dose phenylephrine otc.

## 2012-02-06 NOTE — Assessment & Plan Note (Signed)
This seems early to be having seasonal pollen related wheezing and I think more likely she is dealing with a mild viral infection. We think her medications will control it.

## 2012-03-16 ENCOUNTER — Telehealth: Payer: Self-pay | Admitting: Internal Medicine

## 2012-03-16 MED ORDER — CEFDINIR 300 MG PO CAPS: 300.0000 mg | ORAL_CAPSULE | Freq: Two times a day (BID) | ORAL | Status: AC

## 2012-03-16 NOTE — Telephone Encounter (Signed)
I spoke with pt and is aware of cdy recs. Rx has been sent to the pharmacy and nothing further was needed

## 2012-03-16 NOTE — Telephone Encounter (Signed)
I spoke with pt and she c/o blowing out green phlem w/ streaks of blood in it, eyes are swollen, nasal congestion, trouble hearing from both of her ears, deep dry cough x couple days. Denies any f/c/s/n/v. Pt is requesting to have something called in for her. Please advise Dr. Maple Hudson, thanks  Allergies  Allergen Reactions  . Codeine   . Sulfonamide Derivatives   . Tramadol     REACTION: Nightmares

## 2012-03-16 NOTE — Telephone Encounter (Signed)
Per CY  Cefdinir 300 mg  Take 1 BID  # 20

## 2012-04-30 ENCOUNTER — Telehealth: Payer: Self-pay | Admitting: Internal Medicine

## 2012-04-30 MED ORDER — CLARITHROMYCIN 500 MG PO TABS: 500.0000 mg | ORAL_TABLET | Freq: Two times a day (BID) | ORAL | Status: AC

## 2012-04-30 NOTE — Telephone Encounter (Signed)
Persistent or serial URI's with bronchitis. Green again. Seemed to improve temporarily earlier this spring with cefdinir, amox, then cefdinir again. Zpak doesn't work. Eryrthromycin- intolerant with mood change and malaise. Plan - [facility].

## 2012-05-04 ENCOUNTER — Telehealth: Payer: Self-pay | Admitting: Internal Medicine

## 2012-05-04 MED ORDER — PREDNISONE 10 MG PO TABS: ORAL_TABLET | ORAL | Status: DC

## 2012-05-04 NOTE — Telephone Encounter (Signed)
Persistent sinus infection/ bronchitis slowly responding to treatment. Now calls short of breath asks prednisone. F/u discussed. Plan- prednisone taper.

## 2012-06-09 ENCOUNTER — Other Ambulatory Visit: Payer: Self-pay | Admitting: Internal Medicine

## 2012-06-09 MED ORDER — MONTELUKAST SODIUM 10 MG PO TABS: 10.0000 mg | ORAL_TABLET | Freq: Every day | ORAL | Status: DC

## 2012-06-09 NOTE — Telephone Encounter (Signed)
[facility] REQUESTING  SINGULAR 10 MG <.> TAKE 1 TABLET PRN  #90 X 1 RX SENT

## 2012-08-08 ENCOUNTER — Ambulatory Visit (INDEPENDENT_AMBULATORY_CARE_PROVIDER_SITE_OTHER): Payer: 59 | Admitting: Internal Medicine

## 2012-08-08 ENCOUNTER — Encounter: Payer: Self-pay | Admitting: Internal Medicine

## 2012-08-08 VITALS — BP 118/82 | HR 78 | Ht 61.0 in | Wt 216.8 lb

## 2012-08-08 DIAGNOSIS — J302 Other seasonal allergic rhinitis: Secondary | ICD-10-CM

## 2012-08-08 DIAGNOSIS — J309 Allergic rhinitis, unspecified: Secondary | ICD-10-CM

## 2012-08-08 DIAGNOSIS — J45998 Other asthma: Secondary | ICD-10-CM

## 2012-08-08 DIAGNOSIS — J45909 Unspecified asthma, uncomplicated: Secondary | ICD-10-CM

## 2012-08-08 NOTE — Progress Notes (Signed)
Patient ID: Brandy Oconnell, female    DOB: October 29, 1941, 71 y.o.   MRN: [id number]  HPI 07/29/11-40 year old never smoker followed for asthma, Allergic rhinitis, bronchitis Last here January 28, 2011 -note reviewed Noticing some sneezing in last few days- seasonal. Asthmatic bronchitis has been significantly improved by resumption of Singulair. Rescue inhaler is not needed often, less than once per week. Continues aware of palpitation without change and without chest pain or syncope. 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 Ankle edema improved significantly as Norvasc dose reduced.  02/03/12- 66 year old never smoker followed for asthma, Allergic rhinitis, bronchitis Nasal stuffiness. Sudafed caused palpitations. Allegra caused nausea. She prefers Zyrtec.. Does saline nasal rinse. Not wheezing now. Does have a nebulizer with Xopenex for use if needed.  08/08/12- 70 year old never smoker followed for asthma, Allergic rhinitis, bronchitis Starting to have slight runny nose due to ragweed. Daily Singulair prevents coughing she denies wheeze. Occasionally needs her rescue Xopenex inhaler, less than once per week. 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  Review of Systems- see HPI Constitutional:   No-   weight loss, night sweats, fevers, chills,  + fatigue, lassitude. HEENT:   No-  headaches, difficulty swallowing, tooth/dental problems, sore throat,       +-  sneezing, itching, ear ache, nasal congestion, post nasal drip,  CV:  No-   chest pain, orthopnea, PND, + Improved swelling in lower extremities, No- anasarca, dizziness,  + palpitations Resp: + shortness of breath with exertion or at rest.              No-   productive cough,  No non-productive cough,  No-  coughing up of blood.              No-   change in color of mucus.  No- wheezing.   Skin: No-   rash or  lesions. GI:  No-   heartburn, indigestion, abdominal pain, nausea, vomiting,  GU:  MS:  +  joint pain or swelling. . Neuro- nothing unusual  Psych:  No- change in mood or affect. No depression or anxiety.  No memory loss.     Objective:   Physical Exam General- Alert, Oriented, Affect-guarded, Distress- none acute;  overweight Skin- rash-none, lesions- none, excoriation- none Lymphadenopathy- none Head- atraumatic            Eyes- Gross vision intact, PERRLA, conjunctivae clear secretions            Ears- Hearing, canals scaling and dry wax- previous diagnosis of seborrheic dermatitis            Nose- + mild turbinate edema, No-Septal dev, mucus, polyps, erosion, perforation             Throat- Mallampati III , mucosa clear , drainage- none, tonsils- atrophic Neck- flexible , trachea midline, no stridor , thyroid nl, carotid no bruit Chest - symmetrical excursion , unlabored           Heart/CV- RRR , no murmur , no gallop , no rub, nl s1 s2                           - JVD- none , edema- none, stasis changes- none, varices- none           Lung- coarse breath sounds, unlabored, wheeze- mild, cough- none , dullness-none,  rub- none           Chest wall-  Abd-  Br/ Gen/ Rectal- Not done, not indicated Extrem- cyanosis- none, clubbing, none, atrophy- none, strength- nl Neuro- grossly intact to observation

## 2012-08-08 NOTE — Patient Instructions (Addendum)
Sample Dymista nasal spray   Try 1-2 puffs each nostril once daily at bedtime instead of fluticasone/ Flonase

## 2012-08-14 NOTE — Assessment & Plan Note (Signed)
Recent exacerbation consistent with onset of fall pollen season. Plan- sample of Dymista nasal spray

## 2012-08-14 NOTE — Assessment & Plan Note (Signed)
Mild intermittent asthma no additional intervention needed now.

## 2012-10-09 ENCOUNTER — Other Ambulatory Visit: Payer: Self-pay | Admitting: Internal Medicine

## 2012-10-23 ENCOUNTER — Telehealth: Payer: Self-pay | Admitting: Internal Medicine

## 2012-10-23 NOTE — Telephone Encounter (Signed)
Phone call from patient. Patient c/o Sore throat x 3 days with pressure in face and HA, prod cough with Evelyne Makepeace mucus. Patient complains of Fever and Chills. Has been treating with Tylenol cold and delsym with little relief.   Patient states that it is difficult for her to get out of the house d/t her living alone. Patient would like to know what can be done to make her feel better. Dr Maple Hudson please advise. Thanks.   Allergies  Allergen Reactions  . Erythromycin   . Codeine   . Sulfonamide Derivatives   . Tramadol     REACTION: Nightmares

## 2012-10-23 NOTE — Telephone Encounter (Signed)
Given samples of Factiv 320 mg, # 5, 1 daily. Fluids, tylenol.

## 2012-10-24 NOTE — Telephone Encounter (Signed)
Pt has the samples and will call if her sxs do not improve or if things get worse she will seek emergency help.

## 2012-10-24 NOTE — Telephone Encounter (Signed)
LMOMTCB x 1 

## 2012-12-07 ENCOUNTER — Other Ambulatory Visit: Payer: Self-pay | Admitting: *Deleted

## 2012-12-07 MED ORDER — MONTELUKAST SODIUM 10 MG PO TABS: 10.0000 mg | ORAL_TABLET | Freq: Every day | ORAL | Status: DC

## 2013-02-08 ENCOUNTER — Ambulatory Visit (INDEPENDENT_AMBULATORY_CARE_PROVIDER_SITE_OTHER): Payer: Medicare Other | Admitting: Internal Medicine

## 2013-02-08 ENCOUNTER — Ambulatory Visit (INDEPENDENT_AMBULATORY_CARE_PROVIDER_SITE_OTHER)
Admission: RE | Admit: 2013-02-08 | Discharge: 2013-02-08 | Disposition: A | Discharge status: Home / Self Care | Payer: Medicare Other | Source: Ambulatory Visit | Attending: Internal Medicine | Admitting: Internal Medicine

## 2013-02-08 ENCOUNTER — Encounter: Payer: Self-pay | Admitting: Internal Medicine

## 2013-02-08 VITALS — BP 122/74 | HR 75 | Ht 61.0 in | Wt 220.2 lb

## 2013-02-08 DIAGNOSIS — J45998 Other asthma: Secondary | ICD-10-CM

## 2013-02-08 DIAGNOSIS — J45909 Unspecified asthma, uncomplicated: Secondary | ICD-10-CM

## 2013-02-08 DIAGNOSIS — J309 Allergic rhinitis, unspecified: Secondary | ICD-10-CM

## 2013-02-08 MED ORDER — AZELASTINE-FLUTICASONE 137-50 MCG/ACT NA SUSP: 2.0000 | Freq: Every day | NASAL | Status: DC

## 2013-02-08 NOTE — Patient Instructions (Addendum)
Order- CXR  Dx asthma  Sample Dymista  Nasal spray   1-2 puffs each nostril once daily at bedtime

## 2013-02-08 NOTE — Progress Notes (Signed)
Patient ID: Brandy Oconnell, female    DOB: [birth year], 72 y.o.   MRN: [id number]  HPI 07/29/11-51 year old never smoker followed for asthma, Allergic rhinitis, bronchitis Last here January 28, 2011 -note reviewed Noticing some sneezing in last few days- seasonal. Asthmatic bronchitis has been significantly improved by resumption of Singulair. Rescue inhaler is not needed often, less than once per week. Continues aware of palpitation without change and without chest pain or syncope. 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 Ankle edema improved significantly as Norvasc dose reduced.  02/03/12- 59 year old never smoker followed for asthma, Allergic rhinitis, bronchitis Nasal stuffiness. Sudafed caused palpitations. Allegra caused nausea. She prefers Zyrtec.. Does saline nasal rinse. Not wheezing now. Does have a nebulizer with Xopenex for use if needed.  08/08/12- 16 year old never smoker followed for asthma, Allergic rhinitis, bronchitis Starting to have slight runny nose due to ragweed. Daily Singulair prevents coughing she denies wheeze. Occasionally needs her rescue Xopenex inhaler, less than once per week. 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  02/08/13- 11 year old never smoker followed for asthma, Allergic rhinitis, bronchitis FOLLOWS FOR: sneezing alot since last visit; slight wheezing yesterday, as long as on singulair no cough. Nasal stuffiness. Little wheeze. She reminds me of surgical history-turbinate reduction. Mobility has been limited by arthritis pain and knee. Has had peripheral edema with some stasis changes lower extremities. CXR 09/01/10 IMPRESSION:  1. Bilateral peribronchial thickening. This can be seen in the  setting of asthma, smoking, or viral infection, and may be chronic.  2. No airspace disease.  3. Borderline cardiomegaly.  Provider:  Darrelyn Hillock   Review of Systems- see HPI Constitutional:   No-   weight loss, night sweats, fevers, chills,  + fatigue, lassitude. HEENT:   No-  headaches, difficulty swallowing, tooth/dental problems, sore throat,       +-  sneezing, itching, ear ache, nasal congestion, post nasal drip,  CV:  No-   chest pain, orthopnea, PND, + swelling in lower extremities, No- anasarca, dizziness,  + palpitations Resp: + shortness of breath with exertion or at rest.              No-   productive cough,  No non-productive cough,  No-  coughing up of blood.              No-   change in color of mucus.  No- wheezing.   Skin: No-   rash or lesions. GI:  No-   heartburn, indigestion, abdominal pain, nausea, vomiting,  GU:  MS:  +  joint pain or swelling. . Neuro- nothing unusual  Psych:  No- change in mood or affect. No depression or anxiety.  No memory loss.    Objective:   Physical Exam General- Alert, Oriented, Affect-guarded, Distress- none acute;  overweight Skin- rash-none, lesions- none, excoriation- none Lymphadenopathy- none Head- atraumatic            Eyes- Gross vision intact, PERRLA, conjunctivae clear secretions            Ears- Hearing- normal            Nose- + mild turbinate edema, No-Septal dev, mucus, polyps, erosion, perforation             Throat- Mallampati III , mucosa clear , drainage- none, tonsils- atrophic Neck- flexible , trachea midline, no stridor , thyroid nl, carotid no bruit Chest -  symmetrical excursion , unlabored           Heart/CV- RRR , no murmur , no gallop , no rub, nl s1 s2                           - JVD- none , edema- 2+, stasis changes+, varices- none           Lung- clear, unlabored, wheeze- none, cough- none , dullness-none, rub- none           Chest wall-  Abd-  Br/ Gen/ Rectal- Not done, not indicated Extrem- cyanosis- none, clubbing, none, atrophy- none, strength- nl Neuro- grossly intact to observation

## 2013-02-10 NOTE — Assessment & Plan Note (Signed)
Increased rhinitis, nonspecific but possibly early seasonal allergy. Plan-sample Dymista

## 2013-02-10 NOTE — Assessment & Plan Note (Signed)
Asthma symptoms have not been prominent. We discussed chest x-ray report from 2011 showing prominent markings. Plan-chest x-ray to update.

## 2013-02-12 NOTE — Progress Notes (Signed)
Quick Note:  LMTCB ______ 

## 2013-02-13 ENCOUNTER — Telehealth: Payer: Self-pay | Admitting: Internal Medicine

## 2013-02-13 NOTE — Telephone Encounter (Signed)
Brandy Oconnell was calling to inform pt:  Notes Recorded by Waymon Budge, MD on 02/08/2013 at 8:27 PM CXR- some thickened markings, not worse than before. Minor crowding of markings in the right base ("atelectasis") Nothing looks active or worrisome  ---------  Called, spoke with pt.  Informed her of cxr results per Dr. Maple Hudson.  She verbalized understanding and voiced no further questions or concerns at this time.

## 2013-02-13 NOTE — Progress Notes (Signed)
Quick Note:  Spoke with pt. She is aware of cxr results per Dr. Maple Hudson. ______

## 2013-08-08 ENCOUNTER — Encounter: Payer: Self-pay | Admitting: Internal Medicine

## 2013-08-08 ENCOUNTER — Ambulatory Visit (INDEPENDENT_AMBULATORY_CARE_PROVIDER_SITE_OTHER): Payer: Medicare Other | Admitting: Internal Medicine

## 2013-08-08 VITALS — BP 110/68 | HR 85 | Ht 61.0 in | Wt 219.4 lb

## 2013-08-08 DIAGNOSIS — J309 Allergic rhinitis, unspecified: Secondary | ICD-10-CM

## 2013-08-08 DIAGNOSIS — J45998 Other asthma: Secondary | ICD-10-CM

## 2013-08-08 DIAGNOSIS — J45909 Unspecified asthma, uncomplicated: Secondary | ICD-10-CM

## 2013-08-08 DIAGNOSIS — J302 Other seasonal allergic rhinitis: Secondary | ICD-10-CM

## 2013-08-08 MED ORDER — MONTELUKAST SODIUM 10 MG PO TABS: 10.0000 mg | ORAL_TABLET | Freq: Every day | ORAL | Status: DC

## 2013-08-08 MED ORDER — LEVALBUTEROL HCL 1.25 MG/3ML IN NEBU: INHALATION_SOLUTION | RESPIRATORY_TRACT | Status: DC

## 2013-08-08 NOTE — Patient Instructions (Addendum)
Scripts sent to [facility] for singulair and xopenex neb solution  Please call as needed

## 2013-08-08 NOTE — Progress Notes (Signed)
Patient ID: Brandy Oconnell, female    DOB: June 02, 1941, 72 y.o.   MRN: [id number]  HPI 07/29/11-68 year old never smoker followed for asthma, Allergic rhinitis, bronchitis Last here January 28, 2011 -note reviewed Noticing some sneezing in last few days- seasonal. Asthmatic bronchitis has been significantly improved by resumption of Singulair. Rescue inhaler is not needed often, less than once per week. Continues aware of palpitation without change and without chest pain or syncope. 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 Ankle edema improved significantly as Norvasc dose reduced.  02/03/12- 23 year old never smoker followed for asthma, Allergic rhinitis, bronchitis Nasal stuffiness. Sudafed caused palpitations. Allegra caused nausea. She prefers Zyrtec.. Does saline nasal rinse. Not wheezing now. Does have a nebulizer with Xopenex for use if needed.  08/08/12- 75 year old never smoker followed for asthma, Allergic rhinitis, bronchitis Starting to have slight runny nose due to ragweed. Daily Singulair prevents coughing she denies wheeze. Occasionally needs her rescue Xopenex inhaler, less than once per week. 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  02/08/13- 46 year old never smoker followed for asthma, Allergic rhinitis, bronchitis FOLLOWS FOR: sneezing alot since last visit; slight wheezing yesterday, as long as on singulair no cough. Nasal stuffiness. Little wheeze. She reminds me of surgical history-turbinate reduction. Mobility has been limited by arthritis pain and knee. Has had peripheral edema with some stasis changes lower extremities. CXR 09/01/10 IMPRESSION:  1. Bilateral peribronchial thickening. This can be seen in the  setting of asthma, smoking, or viral infection, and may be chronic.  2. No airspace disease.  3. Borderline cardiomegaly.  Provider:  Darrelyn Hillock  08/08/13- 81 year old never smoker followed for asthma, Allergic rhinitis, bronchitis FOLLOWS FOR: humid days patient has to stay indoors as much as possible due to being unable ot breathe. Pt has started to sneeze. Has started seasonal ragweed sniffing-antihistamine. Needs daily Singulair to prevent coughing. Home being treated for bed bugs CXR 02/08/13- IMPRESSION:  Bronchitic changes with minimal right basilar atelectasis.  Original Report Authenticated By: Ulyses Southward, M.D.  Review of Systems- see HPI Constitutional:   No-   weight loss, night sweats, fevers, chills,  + fatigue, lassitude. HEENT:   No-  headaches, difficulty swallowing, tooth/dental problems, sore throat,       +-  sneezing, itching, ear ache, nasal congestion, post nasal drip,  CV:  No-   chest pain, orthopnea, PND, + swelling in lower extremities, No- anasarca, dizziness,  + palpitations Resp: + shortness of breath with exertion or at rest.              No-   productive cough,  No non-productive cough,  No-  coughing up of blood.              No-   change in color of mucus.  No- wheezing.   Skin: No-   rash or lesions. GI:  No-   heartburn, indigestion, abdominal pain, nausea, vomiting,  GU:  MS:  +  joint pain or swelling. . Neuro- nothing unusual  Psych:  No- change in mood or affect. No depression or anxiety.  No memory loss.    Objective:   Physical Exam General- Alert, Oriented, Affect-guarded, Distress- none acute;  overweight Skin- rash-none, lesions- none, excoriation- none Lymphadenopathy- none Head- atraumatic            Eyes- Gross vision intact, PERRLA, conjunctivae clear secretions  Ears- Hearing- normal            Nose- + mild turbinate edema, No-Septal dev, mucus, polyps, erosion, perforation             Throat- Mallampati III , mucosa clear , drainage- none, tonsils- atrophic Neck- flexible , trachea midline, no stridor , thyroid nl, carotid no bruit Chest -  symmetrical excursion , unlabored           Heart/CV- RRR , no murmur , no gallop , no rub, nl s1 s2                           - JVD- none , edema-none, stasis changes+, varices- none           Lung- clear, unlabored, wheeze- none, cough- none , dullness-none, rub- none           Chest wall-  Abd-  Br/ Gen/ Rectal- Not done, not indicated Extrem- cyanosis- none, clubbing, none, atrophy- none, strength- nl Neuro- grossly intact to observation

## 2013-08-19 NOTE — Assessment & Plan Note (Signed)
Plan to continue Dymista, Singulair, Claritin

## 2013-08-19 NOTE — Assessment & Plan Note (Signed)
Xopenex for her nebulizer machine, and HFA. May also need nebulized Atrovent

## 2013-08-24 ENCOUNTER — Encounter: Payer: Self-pay | Admitting: Neurology

## 2013-08-28 ENCOUNTER — Encounter: Payer: Self-pay | Admitting: Neurology

## 2013-09-13 ENCOUNTER — Ambulatory Visit (INDEPENDENT_AMBULATORY_CARE_PROVIDER_SITE_OTHER): Payer: Medicare Other | Admitting: Neurology

## 2013-09-13 ENCOUNTER — Encounter: Payer: Self-pay | Admitting: Neurology

## 2013-09-13 VITALS — BP 148/73 | HR 81 | Wt 216.0 lb

## 2013-09-13 DIAGNOSIS — G250 Essential tremor: Secondary | ICD-10-CM

## 2013-09-13 DIAGNOSIS — G43009 Migraine without aura, not intractable, without status migrainosus: Secondary | ICD-10-CM

## 2013-09-13 DIAGNOSIS — G252 Other specified forms of tremor: Secondary | ICD-10-CM | POA: Insufficient documentation

## 2013-09-13 HISTORY — DX: Essential tremor: G25.0

## 2013-09-13 HISTORY — DX: Migraine without aura, not intractable, without status migrainosus: G43.009

## 2013-09-13 MED ORDER — SUMATRIPTAN SUCCINATE 100 MG PO TABS: 100.0000 mg | ORAL_TABLET | Freq: Two times a day (BID) | ORAL | Status: DC | PRN

## 2013-09-13 NOTE — Progress Notes (Signed)
Reason for visit: Headache  Brandy Oconnell is an 72 y.o. female  History of present illness:  Brandy Oconnell is a 72 year old right-handed white female with a long-standing history of migraine headache. In the past, the patient has had more frequent headaches, but as she has gotten older, the headaches have become less frequent and less severe. The patient is averaging about 1 headache a month. The last headache was 3 weeks ago. The patient in the past has noted that Midrin will help her headaches, but her insurance company will not cover this medication at this time. The patient has also had some issues with a mild benign essential tremor. The patient within the last year has noted some numbness and tingly sensations in the right hand, and this issue may wake her up at night. The patient denies any neck discomfort. The patient returns to this office for an evaluation. The patient has been followed by Dr. Sandria Manly for number of years.  Past Medical History  Diagnosis Date  . ALLERGIC RHINITIS   . Asthma   . Hypertension   . Bronchiectasis   . Hyperlipidemia   . Hypothyroidism   . Obesity   . Palpitation   . Depression   . Migraine   . Pelvic fracture   . Migraine without aura, without mention of intractable migraine without mention of status migrainosus 09/13/2013  . Essential and other specified forms of tremor 09/13/2013  . Fibromyalgia   . Degenerative arthritis     Past Surgical History  Procedure Laterality Date  . Vesicovaginal fistula closure w/ tah  1984  . Gyn laparoscopy    . Nasal septum surgery    . Laparoscopic hysterectomy      Family History  Problem Relation Age of Onset  . Heart disease Brother   . Heart disease Mother   . Asthma Mother   . Kidney cancer Mother   . Cancer Mother   . Diabetes Mother   . Stroke Mother   . Tremor Mother   . Headache Mother   . Heart disease Sister   . COPD Father   . Stroke Father     Social history:  reports that she has  never smoked. She has never used smokeless tobacco. She reports that she does not drink alcohol or use illicit drugs.    Allergies  Allergen Reactions  . Erythromycin   . Codeine   . Sulfonamide Derivatives   . Tramadol     REACTION: Nightmares    Medications:  Current Outpatient Prescriptions on File Prior to Visit  Medication Sig Dispense Refill  . Azelastine-Fluticasone (DYMISTA) 137-50 MCG/ACT SUSP Place 2 sprays into both nostrils at bedtime.  1 Bottle  0  . Calcium Carbonate (CALCIUM 600 PO) Take 2 tablets by mouth daily.        . ergocalciferol (VITAMIN D2) 50000 UNITS capsule Take 1 tablet three days each week (m,w,f)      . furosemide (LASIX) 40 MG tablet Take 40 mg by mouth daily. In fluid retention take 1/2 tablet extra      . levalbuterol (XOPENEX) 1.25 MG/3ML nebulizer solution 1 neb up to 4 times daily as needed  225 mL  3  . levothyroxine (SYNTHROID, LEVOTHROID) 112 MCG tablet Take 112 mcg by mouth daily. Take 1/2 tab extra on Sundays      . loratadine (CLARITIN) 10 MG tablet Take 10 mg by mouth daily.      Marland Kitchen LORazepam (ATIVAN) 1 MG tablet Take  1 mg by mouth 2 (two) times daily as needed.        Marland Kitchen losartan (COZAAR) 50 MG tablet Take 50 mg by mouth 2 (two) times daily.       . montelukast (SINGULAIR) 10 MG tablet Take 1 tablet (10 mg total) by mouth at bedtime.  90 tablet  3  . Multiple Vitamin (MULTIVITAMIN) tablet Take 1 tablet by mouth daily.      Marland Kitchen neomycin-polymyxin-hydrocortisone (CORTISPORIN) 3.5-10000-1 otic suspension 2-3 drops in ear two times a day  RARE USE PRN      . pravastatin (PRAVACHOL) 10 MG tablet Take 10 mg by mouth every other day.       . fluticasone (FLONASE) 50 MCG/ACT nasal spray Place 2 sprays into the nose daily.  16 g  prn  . levalbuterol (XOPENEX HFA) 45 MCG/ACT inhaler Inhale 2 puffs into the lungs 4 (four) times daily as needed for wheezing or shortness of breath.  1 Inhaler  prn   No current facility-administered medications on file prior  to visit.    ROS:  Out of a complete 14 system review of symptoms, the patient complains only of the following symptoms, and all other reviewed systems are negative.  Weight gain Swelling in the legs Joint pain Allergies Headaches, numbness, weakness Tremor  Blood pressure 148/73, pulse 81, weight 216 lb (97.977 kg).  Physical Exam  General: The patient is alert and cooperative at the time of the examination. The patient is moderately obese.  Skin: 1+ edema at the ankles is noted bilaterally.   Neurologic Exam  Cranial nerves: Facial symmetry is present. Speech is normal, no aphasia or dysarthria is noted. Extraocular movements are full. Visual fields are full.  Motor: The patient has good strength in all 4 extremities.  Coordination: The patient has good finger-nose-finger and heel-to-shin bilaterally. A minimal intention tremor seen with finger-nose-finger. Tinel sign is mildly positive on the right, negative on the left wrist.  Gait and station: The patient has a normal gait. Tandem gait is slightly unsteady. Romberg is negative. No drift is seen.  Reflexes: Deep tendon reflexes are symmetric, but are depressed.   Assessment/Plan:  1. Migraine headache  2. Benign essential tremor  3. Right hand numbness, rule out carpal tunnel syndrome  The patient will be given a prescription for Imitrex tablets to see this helps her migraine. The patient will contact me if she needs a prescription. The patient is having some problems with right hand numbness. The patient will try using a wrist splint on the right wrist for 6-8 weeks. If this is not effective, the patient will call me and I will set her up for nerve conduction studies of both arms, EMG of the right arm. The patient will followup through this otherwise in one year.  Marlan Palau MD 09/13/2013 7:14 PM  [facility] [address] [place], [place] [address]  Phone [phone] Fax  [phone]

## 2013-10-09 ENCOUNTER — Encounter: Payer: Self-pay | Admitting: Internal Medicine

## 2013-10-09 ENCOUNTER — Ambulatory Visit (INDEPENDENT_AMBULATORY_CARE_PROVIDER_SITE_OTHER): Payer: Medicare Other | Admitting: Internal Medicine

## 2013-10-09 VITALS — BP 138/72 | HR 72 | Ht 61.0 in | Wt 216.0 lb

## 2013-10-09 DIAGNOSIS — J302 Other seasonal allergic rhinitis: Secondary | ICD-10-CM

## 2013-10-09 DIAGNOSIS — IMO0002 Reserved for concepts with insufficient information to code with codable children: Secondary | ICD-10-CM

## 2013-10-09 DIAGNOSIS — J449 Chronic obstructive pulmonary disease, unspecified: Secondary | ICD-10-CM | POA: Insufficient documentation

## 2013-10-09 DIAGNOSIS — J309 Allergic rhinitis, unspecified: Secondary | ICD-10-CM

## 2013-10-09 DIAGNOSIS — F515 Nightmare disorder: Secondary | ICD-10-CM

## 2013-10-09 MED ORDER — LORAZEPAM 1 MG PO TABS: 1.0000 mg | ORAL_TABLET | Freq: Two times a day (BID) | ORAL | Status: DC | PRN

## 2013-10-09 NOTE — Progress Notes (Signed)
Patient ID: Brandy Oconnell, female    DOB: 1941-08-21, 71 y.o.   MRN: [id number]  HPI 07/29/11-59 year old never smoker followed for asthma, Allergic rhinitis, bronchitis Last here January 28, 2011 -note reviewed Noticing some sneezing in last few days- seasonal. Asthmatic bronchitis has been significantly improved by resumption of Singulair. Rescue inhaler is not needed often, less than once per week. Continues aware of palpitation without change and without chest pain or syncope. 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 Ankle edema improved significantly as Norvasc dose reduced.  02/03/12- 54 year old never smoker followed for asthma, Allergic rhinitis, bronchitis Nasal stuffiness. Sudafed caused palpitations. Allegra caused nausea. She prefers Zyrtec.. Does saline nasal rinse. Not wheezing now. Does have a nebulizer with Xopenex for use if needed.  08/08/12- 8 year old never smoker followed for asthma, Allergic rhinitis, bronchitis Starting to have slight runny nose due to ragweed. Daily Singulair prevents coughing she denies wheeze. Occasionally needs her rescue Xopenex inhaler, less than once per week. 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  02/08/13- 75 year old never smoker followed for asthma, Allergic rhinitis, bronchitis FOLLOWS FOR: sneezing alot since last visit; slight wheezing yesterday, as long as on singulair no cough. Nasal stuffiness. Little wheeze. She reminds me of surgical history-turbinate reduction. Mobility has been limited by arthritis pain and knee. Has had peripheral edema with some stasis changes lower extremities. CXR 09/01/10 IMPRESSION:  1. Bilateral peribronchial thickening. This can be seen in the  setting of asthma, smoking, or viral infection, and may be chronic.  2. No airspace disease.  3. Borderline cardiomegaly.  Provider:  Darrelyn Hillock  08/08/13- 87 year old never smoker followed for asthma, Allergic rhinitis, bronchitis FOLLOWS FOR: humid days patient has to stay indoors as much as possible due to being unable ot breathe. Pt has started to sneeze. Has started seasonal ragweed sniffing-antihistamine. Needs daily Singulair to prevent coughing. Home being treated for bed bugs CXR 02/08/13- IMPRESSION:  Bronchitic changes with minimal right basilar atelectasis.  Original Report Authenticated By: Ulyses Southward, M.D.  10/09/13-  52 year old never smoker followed for asthma/ chronic bronchitis, Allergic rhinitis,  FOLLOWS FOR:c/o runny nose,sneezing x 1 wk. worse,no sob,denies cough or wheeze Depending on loratadine and singulair. C/o watery nose and wakes w/ bloody crusting. Sneezing, no HA. No wheeze. Uses nebulizer very occasionally.  Discussed sleep- frequent night mares- asks lorazepam refill.  Review of Systems- see HPI Constitutional:   No-   weight loss, night sweats, fevers, chills,   fatigue, lassitude. HEENT:   No-  headaches, difficulty swallowing, tooth/dental problems, sore throat,       +-  sneezing, itching, ear ache, nasal congestion, post nasal drip,  CV:  No-   chest pain, orthopnea, PND, + swelling in lower extremities, No- anasarca, dizziness,  + palpitations Resp: + shortness of breath with exertion or at rest.              No-   productive cough,  No non-productive cough,  No-  coughing up of blood.              No-   change in color of mucus.  No- wheezing.   Skin: No-   rash or lesions. GI:  No-   heartburn, indigestion, abdominal pain, nausea, vomiting,  GU:  MS:  +  joint pain or swelling. . Neuro- nothing unusual  Psych:  No- change in mood or affect.  No depression or anxiety.  No memory loss.    Objective:   Physical Exam General- Alert, Oriented, Affect-guarded, Distress- none acute;  overweight Skin- rash-none, lesions- none, excoriation- none Lymphadenopathy- none Head-  atraumatic            Eyes- Gross vision intact, PERRLA, conjunctivae clear secretions            Ears- Hearing- normal            Nose- + mild turbinate edema, No-Septal dev, mucus, polyps, erosion, perforation             Throat- Mallampati III , mucosa clear , drainage- none, tonsils- atrophic Neck- flexible , trachea midline, no stridor , thyroid nl, carotid no bruit Chest - symmetrical excursion , unlabored           Heart/CV- RRR , no murmur , no gallop , no rub, nl s1 s2                           - JVD- none , edema-none, stasis changes+, varices- none           Lung- clear, unlabored, wheeze- none, cough- none , dullness-none, rub- none           Chest wall-  Abd-  Br/ Gen/ Rectal- Not done, not indicated Extrem- cyanosis- none, clubbing, none, atrophy- none, strength- nl Neuro- grossly intact to observation

## 2013-10-09 NOTE — Assessment & Plan Note (Signed)
No recent problem. CXRs have shown bronchial thickening c/w chronic bronchitis changes.

## 2013-10-09 NOTE — Assessment & Plan Note (Signed)
Seasonal exacerbation. We discussed drying and non-allergic rhinitis now.  Plan- uses saline nasal gel, antihistamines

## 2013-10-09 NOTE — Assessment & Plan Note (Signed)
Not sure of significance. Intermittent, occasional. Discussed use of lorazepam occasionally and agreed to refill for occasional use.

## 2013-10-09 NOTE — Patient Instructions (Signed)
Script for lorazepam refill  Try the saline nasal gels as much as you need to prevent over-drying

## 2013-11-14 ENCOUNTER — Ambulatory Visit (INDEPENDENT_AMBULATORY_CARE_PROVIDER_SITE_OTHER): Payer: Medicare Other | Admitting: Cardiovascular Disease

## 2013-11-14 ENCOUNTER — Encounter: Payer: Self-pay | Admitting: Cardiovascular Disease

## 2013-11-14 VITALS — BP 142/72 | HR 72 | Resp 16 | Ht 61.5 in | Wt 215.5 lb

## 2013-11-14 DIAGNOSIS — I10 Essential (primary) hypertension: Secondary | ICD-10-CM

## 2013-11-14 DIAGNOSIS — Z0389 Encounter for observation for other suspected diseases and conditions ruled out: Secondary | ICD-10-CM

## 2013-11-14 DIAGNOSIS — E785 Hyperlipidemia, unspecified: Secondary | ICD-10-CM

## 2013-11-14 NOTE — Patient Instructions (Signed)
Your physician recommends that you schedule a follow-up appointment in: 12 months.  

## 2013-11-18 ENCOUNTER — Encounter: Payer: Self-pay | Admitting: Cardiovascular Disease

## 2013-11-18 NOTE — Assessment & Plan Note (Signed)
We do not have a copy of her most recent blood tests. She recalls her her triglycerides were slightly high at 167 and that her cholesterol was around 244. These values are comparable with the most recent labs that I have which showed a HDL of 49 and an LDL of 173 roughly one year ago. Actually she has been intolerance to daily statin dose and pravastatin in a very low every other day dose is the most that she can do. She does not have known coronary artery vascular disease and in fact had normal coronary angiography in the remote past. Aggressive cholesterol-lowering therefore appears to be associated with more risk than benefit. No changes are made to her medications. She is encouraged to follow a low carbohydrate low saturated fat slightly hypocaloric diets which should assist with weight loss. She is also encouraged to increase her levels of physical activity.

## 2013-11-18 NOTE — Progress Notes (Signed)
Patient ID: Brandy Oconnell, female   DOB: 1941/05/25, 72 y.o.   MRN: [id number]      Reason for office visit Hypercholesterolemia, hypertension  Brandy Oconnell has been well since her last appointment. 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 She recently had laboratory tests performed at Dr. Evlyn Kanner. She remembers them as being essentially unchanged from the year before but I do not have the results to go over with her. She has been unable to take pravastatin on a daily basis. She develops myalgia. She has had similar side effects with Crestor and Vytorin. She has not been troubled anymore by palpitations. Her lower extremity edema is very well controlled as long as she takes furosemide. She has noticed that if she takes her losartan twice a day and also takes an increased amount of furosemide for her ankle edema she becomes dizzy and weak. She only needs to take furosemide in an increased dose a couple of times a month. Swelling has become much less of a problem since she retired.  Allergies  Allergen Reactions  . Erythromycin   . Codeine   . Sulfonamide Derivatives   . Tramadol     REACTION: Nightmares    Current Outpatient Prescriptions  Medication Sig Dispense Refill  . Azelastine-Fluticasone (DYMISTA) 137-50 MCG/ACT SUSP Place 2 sprays into both nostrils at bedtime.  1 Bottle  0  . Calcium Carbonate (CALCIUM 600 PO) Take 2 tablets by mouth daily.        . ergocalciferol (VITAMIN D2) 50000 UNITS capsule Take 1 tablet three days each week (m,w,f)      . fluticasone (FLONASE) 50 MCG/ACT nasal spray Place 2 sprays into both nostrils daily.      . furosemide (LASIX) 40 MG tablet Take 40 mg by mouth daily. If fluid retention take 1/2 tablet extra      . levalbuterol (XOPENEX HFA) 45 MCG/ACT inhaler Inhale 2 puffs into the lungs every 4 (four) hours as needed for wheezing.      . levalbuterol (XOPENEX) 1.25  MG/3ML nebulizer solution 1 neb up to 4 times daily as needed  225 mL  3  . levothyroxine (SYNTHROID, LEVOTHROID) 112 MCG tablet Take 112 mcg by mouth daily. Take 1/2 tab extra on Sundays      . loratadine (CLARITIN) 10 MG tablet Take 10 mg by mouth daily.      Marland Kitchen LORazepam (ATIVAN) 1 MG tablet Take 1 tablet (1 mg total) by mouth 2 (two) times daily as needed.  60 tablet  5  . losartan (COZAAR) 50 MG tablet Take 50 mg by mouth 2 (two) times daily.       . montelukast (SINGULAIR) 10 MG tablet Take 1 tablet (10 mg total) by mouth at bedtime.  90 tablet  3  . Multiple Vitamin (MULTIVITAMIN) tablet Take 1 tablet by mouth daily.      Marland Kitchen neomycin-polymyxin-hydrocortisone (CORTISPORIN) 3.5-10000-1 otic suspension 2-3 drops in ear two times a day  RARE USE PRN      . pravastatin (PRAVACHOL) 10 MG tablet Take 10 mg by mouth every other day.       . SUMAtriptan (IMITREX) 100 MG tablet Take 1 tablet (100 mg total) by mouth 2 (two) times daily as needed for migraine. May repeat in 2 hours if headache persists or recurs.  10 tablet  3   No current facility-administered medications for this  visit.    Past Medical History  Diagnosis Date  . ALLERGIC RHINITIS   . Asthma   . Hypertension   . Bronchiectasis   . Hyperlipidemia   . Hypothyroidism   . Obesity   . Palpitation   . Depression   . Migraine   . Pelvic fracture   . Migraine without aura, without mention of intractable migraine without mention of status migrainosus 09/13/2013  . Essential and other specified forms of tremor 09/13/2013  . Fibromyalgia   . Degenerative arthritis     Past Surgical History  Procedure Laterality Date  . Vesicovaginal fistula closure w/ tah  1984  . Gyn laparoscopy    . Nasal septum surgery    . Laparoscopic hysterectomy      Family History  Problem Relation Age of Onset  . Heart disease Brother   . Heart disease Mother   . Asthma Mother   . Kidney cancer Mother   . Cancer Mother   . Diabetes Mother   .  Stroke Mother   . Tremor Mother   . Headache Mother   . Heart disease Sister   . COPD Father   . Stroke Father     History   Social History  . Marital Status: Divorced    Spouse Name: N/A    Number of Children: 0  . Years of Education: COLLEGE   Occupational History  . full time clinical lab tech    Social History Main Topics  . Smoking status: Never Smoker   . Smokeless tobacco: Never Used  . Alcohol Use: No  . Drug Use: No  . Sexual Activity: Not on file   Other Topics Concern  . Not on file   Social History Narrative  . No narrative on file    Review of systems: He has mild ankle edema, which resolves if she takes an extra half dose of furosemide. Develops myalgia if she takes pravastatin on a daily basis. She has occasional shortness of breath and wheezing that responds to bronchodilators. Denies chest pain either at rest or with exertion and has not had palpitations syncope or focal neurological deficits. She denies intermittent claudication.  The patient also denies abdominal pain, nausea, vomiting, dysphagia, diarrhea, constipation, polyuria, polydipsia, dysuria, hematuria, frequency, urgency, abnormal bleeding or bruising, fever, chills, unexpected weight changes, mood swings, change in skin or hair texture, change in voice quality, auditory or visual problems, allergic reactions or rashes, new musculoskeletal complaints other than usual "aches and pains".   PHYSICAL EXAM BP 142/72  Pulse 72  Resp 16  Ht 5' 1.5" (1.562 m)  Wt 215 lb 8 oz (97.75 kg)  BMI 40.06 kg/m2 Severely obese General: Alert, oriented x3, no distress Head: no evidence of trauma, PERRL, EOMI, no exophtalmos or lid lag, no myxedema, no xanthelasma; normal ears, nose and oropharynx Neck: normal jugular venous pulsations and no hepatojugular reflux; brisk carotid pulses without delay and no carotid bruits Chest: clear to auscultation, no signs of consolidation by percussion or palpation,  normal fremitus, symmetrical and full respiratory excursions Cardiovascular: normal position and quality of the apical impulse, regular rhythm, normal first and second heart sounds, no murmurs, rubs or gallops Abdomen: no tenderness or distention, no masses by palpation, no abnormal pulsatility or arterial bruits, normal bowel sounds, no hepatosplenomegaly Extremities: no clubbing, cyanosis, 1+ nonpitting bilateral ankle edema; 2+ radial, ulnar and brachial pulses bilaterally; 2+ right femoral, posterior tibial and dorsalis pedis pulses; 2+ left femoral, posterior tibial and dorsalis  pedis pulses; no subclavian or femoral bruits Neurological: grossly nonfocal   EKG: Normal sinus rhythm low pulses in the precordial leads is likely related to body habitus   ASSESSMENT AND PLAN HYPERTENSION Her blood pressure is borderline elevated today but when she recently saw Dr. Maple Hudson her blood pressure was only 116/70. Generally her blood pressure at home is in a similar range. No changes are therefore made to her medications to  HYPERLIPIDEMIA We do not have a copy of her most recent blood tests. She recalls her her triglycerides were slightly high at 167 and that her cholesterol was around 244. These values are comparable with the most recent labs that I have which showed a HDL of 49 and an LDL of 173 roughly one year ago. Actually she has been intolerance to daily statin dose and pravastatin in a very low every other day dose is the most that she can do. She does not have known coronary artery vascular disease and in fact had normal coronary angiography in the remote past. Aggressive cholesterol-lowering therefore appears to be associated with more risk than benefit. No changes are made to her medications. She is encouraged to follow a low carbohydrate low saturated fat slightly hypocaloric diets which should assist with weight loss. She is also encouraged to increase her levels of physical  activity.   Patient Instructions  Your physician recommends that you schedule a follow-up appointment in: 12 months      Meds ordered this encounter  Medications  . fluticasone (FLONASE) 50 MCG/ACT nasal spray    Sig: Place 2 sprays into both nostrils daily.  Marland Kitchen levalbuterol (XOPENEX HFA) 45 MCG/ACT inhaler    Sig: Inhale 2 puffs into the lungs every 4 (four) hours as needed for wheezing.    Junious Silk, MD, [facility] [phone] office [phone] pager

## 2013-11-18 NOTE — Assessment & Plan Note (Signed)
Her blood pressure is borderline elevated today but when she recently saw Dr. Maple Hudson her blood pressure was only 116/70. Generally her blood pressure at home is in a similar range. No changes are therefore made to her medications to

## 2013-11-29 ENCOUNTER — Encounter: Payer: Self-pay | Admitting: Cardiovascular Disease

## 2014-04-09 ENCOUNTER — Ambulatory Visit (INDEPENDENT_AMBULATORY_CARE_PROVIDER_SITE_OTHER): Payer: Medicare Other | Admitting: Internal Medicine

## 2014-04-09 ENCOUNTER — Encounter: Payer: Self-pay | Admitting: Internal Medicine

## 2014-04-09 VITALS — BP 122/78 | HR 67 | Ht 61.0 in | Wt 215.4 lb

## 2014-04-09 DIAGNOSIS — J302 Other seasonal allergic rhinitis: Secondary | ICD-10-CM

## 2014-04-09 DIAGNOSIS — J45998 Other asthma: Secondary | ICD-10-CM

## 2014-04-09 DIAGNOSIS — J45909 Unspecified asthma, uncomplicated: Secondary | ICD-10-CM

## 2014-04-09 DIAGNOSIS — J309 Allergic rhinitis, unspecified: Secondary | ICD-10-CM

## 2014-04-09 DIAGNOSIS — F515 Nightmare disorder: Secondary | ICD-10-CM

## 2014-04-09 DIAGNOSIS — IMO0002 Reserved for concepts with insufficient information to code with codable children: Secondary | ICD-10-CM

## 2014-04-09 DIAGNOSIS — J3089 Other allergic rhinitis: Secondary | ICD-10-CM

## 2014-04-09 NOTE — Progress Notes (Signed)
Patient ID: Brandy LevinsLinda M Oconnell, female    DOB: 04/10/1941, 73 y.o.   MRN: [id number]  HPI 07/29/11-73 year old never smoker followed for asthma, Allergic rhinitis, bronchitis Last here January 28, 2011 -note reviewed Noticing some sneezing in last few days- seasonal. Asthmatic bronchitis has been significantly improved by resumption of Singulair. Rescue inhaler is not needed often, less than once per week. Continues aware of palpitation without change and without chest pain or syncope. 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 Ankle edema improved significantly as Norvasc dose reduced.  02/03/12- 73 year old never smoker followed for asthma, Allergic rhinitis, bronchitis Nasal stuffiness. Sudafed caused palpitations. Allegra caused nausea. She prefers Zyrtec.. Does saline nasal rinse. Not wheezing now. Does have a nebulizer with Xopenex for use if needed.  08/08/12- 73 year old never smoker followed for asthma, Allergic rhinitis, bronchitis Starting to have slight runny nose due to ragweed. Daily Singulair prevents coughing she denies wheeze. Occasionally needs her rescue Xopenex inhaler, less than once per week. 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  02/08/13- 73 year old never smoker followed for asthma, Allergic rhinitis, bronchitis FOLLOWS FOR: sneezing alot since last visit; slight wheezing yesterday, as long as on singulair no cough. Nasal stuffiness. Little wheeze. She reminds me of surgical history-turbinate reduction. Mobility has been limited by arthritis pain and knee. Has had peripheral edema with some stasis changes lower extremities. CXR 09/01/10 IMPRESSION:  1. Bilateral peribronchial thickening. This can be seen in the  setting of asthma, smoking, or viral infection, and may be chronic.  2. No airspace disease.  3. Borderline cardiomegaly.  Provider:  Darrelyn HillockKristin Johnson  08/08/13- 73 year old never smoker followed for asthma, Allergic rhinitis, bronchitis FOLLOWS FOR: humid days patient has to stay indoors as much as possible due to being unable ot breathe. Pt has started to sneeze. Has started seasonal ragweed sniffing-antihistamine. Needs daily Singulair to prevent coughing. Home being treated for bed bugs CXR 02/08/13- IMPRESSION:  Bronchitic changes with minimal right basilar atelectasis.  Original Report Authenticated By: Ulyses SouthwardMark Boles, M.D.  10/09/13-  73 year old never smoker followed for asthma/ chronic bronchitis, Allergic rhinitis,  FOLLOWS FOR:c/o runny nose,sneezing x 1 wk. worse,no sob,denies cough or wheeze Depending on loratadine and singulair. C/o watery nose and wakes w/ bloody crusting. Sneezing, no HA. No wheeze. Uses nebulizer very occasionally.  Discussed sleep- frequent night mares- asks lorazepam refill.  04/09/14- 73 year old never smoker followed for asthma/ chronic bronchitis, Allergic rhinitis, complicated by IBS  FOLLOWS FOR:  Sinus pressure, PND, sneezing and itchy water eyes External otitis responded to Cipro ointment from Dr. [place]. Saw an oral surgeon for tooth abscess and took amoxicillin for 2 weeks. Allergic rhinitis symptoms-daily Claritin. Can't stay outside without rhinorrhea. Continues Singulair but says Allegra and Zyrtec causes nausea.Have overdrying her to the point of epistaxis. Occasional minor wheeze around cut flowers a grocery store, perfumes. Sleep-she was taking Ativan to suppress nightmares but doesn't seem to work. Taking Norpramin 50 mg for irritable bowel.  Review of Systems- see HPI Constitutional:   No-   weight loss, night sweats, fevers, chills,   fatigue, lassitude. HEENT:   No-  headaches, difficulty swallowing, tooth/dental problems, sore throat,       +-  sneezing, itching, ear ache,+nasal congestion, post nasal drip,  CV:  No-   chest pain, orthopnea, PND, + swelling in lower  extremities, No- anasarca, dizziness,                +  palpitations Resp: + shortness of breath with exertion or at rest.              No-   productive cough,  No non-productive cough,  No-  coughing up of blood.              No-   change in color of mucus.  + wheezing.   Skin: No-   rash or lesions. GI:  No-   heartburn, indigestion, abdominal pain, nausea, vomiting,  GU:  MS:  +  joint pain or swelling. . Neuro- nothing unusual  Psych:  No- change in mood or affect. No depression or anxiety.  No memory loss.    Objective:   Physical Exam General- Alert, Oriented, Affect-guarded, Distress- none acute;  overweight Skin- rash-none, lesions- none, excoriation- none Lymphadenopathy- none Head- atraumatic            Eyes- Gross vision intact, PERRLA, conjunctivae clear secretions            Ears- Hearing- normal            Nose- + mild turbinate edema, No-Septal dev, mucus, polyps, erosion, perforation             Throat- Mallampati III , mucosa clear , drainage- none, tonsils- atrophic Neck- flexible , trachea midline, no stridor , thyroid nl, carotid no bruit Chest - symmetrical excursion , unlabored           Heart/CV- RRR , no murmur , no gallop , no rub, nl s1 s2                           - JVD- none , edema-none, stasis changes+, varices- none           Lung- clear, unlabored, wheeze- none, cough- none , dullness-none, rub- none           Chest wall-  Abd-  Br/ Gen/ Rectal- Not done, not indicated Extrem- cyanosis- none, clubbing, none, atrophy- none, strength- nl Neuro- grossly intact to observation

## 2014-04-09 NOTE — Patient Instructions (Signed)
Instead of Claritin consider trying otc Chlor Tri Meton   antihistamine  Instead of Flonase or Dymista nasal sprays consider trying otc Nasalcrom/ Cromol/ cromolyn nasal spray  You can ask the pharmacy to help you find these.    Please call if we can help

## 2014-04-16 ENCOUNTER — Telehealth: Payer: Self-pay | Admitting: Internal Medicine

## 2014-04-16 MED ORDER — LORAZEPAM 1 MG PO TABS: 1.0000 mg | ORAL_TABLET | Freq: Two times a day (BID) | ORAL | Status: DC | PRN

## 2014-04-16 NOTE — Telephone Encounter (Signed)
Last OV 10/09/13 Pending OV 10/10/14 Last fill 10/09/13 #60 with 5 refills  CY - please advise on refill. Thanks.

## 2014-04-16 NOTE — Telephone Encounter (Signed)
Ok to refill 

## 2014-04-16 NOTE — Telephone Encounter (Signed)
Rx has been called in per CY. Pt is aware. Nothing further is needed. 

## 2014-05-06 NOTE — Assessment & Plan Note (Addendum)
Mild intermittent asthma Plan- avoid triggers, watch need for intervention

## 2014-05-06 NOTE — Assessment & Plan Note (Signed)
Treated with Norpramin

## 2014-05-06 NOTE — Assessment & Plan Note (Addendum)
Seasonal exacerbation of allergic rhinitis Plan- instead of steroid-based nasal sprays, try cromolyn and saline gel

## 2014-05-22 ENCOUNTER — Other Ambulatory Visit: Payer: Self-pay | Admitting: Internal Medicine

## 2014-06-20 ENCOUNTER — Other Ambulatory Visit: Payer: Self-pay

## 2014-06-20 DIAGNOSIS — Z1231 Encounter for screening mammogram for malignant neoplasm of breast: Secondary | ICD-10-CM

## 2014-07-04 ENCOUNTER — Ambulatory Visit
Admission: RE | Admit: 2014-07-04 | Discharge: 2014-07-04 | Disposition: A | Discharge status: Home / Self Care | Payer: Medicare Other | Source: Ambulatory Visit

## 2014-07-04 ENCOUNTER — Encounter (INDEPENDENT_AMBULATORY_CARE_PROVIDER_SITE_OTHER): Payer: Self-pay

## 2014-07-04 DIAGNOSIS — Z1231 Encounter for screening mammogram for malignant neoplasm of breast: Secondary | ICD-10-CM

## 2014-07-08 ENCOUNTER — Other Ambulatory Visit: Payer: Self-pay | Admitting: Endocrinology

## 2014-07-08 DIAGNOSIS — R928 Other abnormal and inconclusive findings on diagnostic imaging of breast: Secondary | ICD-10-CM

## 2014-07-12 ENCOUNTER — Ambulatory Visit
Admission: RE | Admit: 2014-07-12 | Discharge: 2014-07-12 | Disposition: A | Discharge status: Home / Self Care | Payer: Medicare Other | Source: Ambulatory Visit | Attending: Endocrinology | Admitting: Endocrinology

## 2014-07-12 DIAGNOSIS — R928 Other abnormal and inconclusive findings on diagnostic imaging of breast: Secondary | ICD-10-CM

## 2014-08-14 ENCOUNTER — Encounter (HOSPITAL_COMMUNITY): Payer: Self-pay | Admitting: Emergency Medicine

## 2014-08-14 ENCOUNTER — Emergency Department (HOSPITAL_COMMUNITY)
Admission: EM | Admit: 2014-08-14 | Discharge: 2014-08-14 | Disposition: A | Discharge status: Home / Self Care | Payer: Medicare Other | Attending: Emergency Medicine | Admitting: Emergency Medicine

## 2014-08-14 ENCOUNTER — Emergency Department (HOSPITAL_COMMUNITY): Payer: Medicare Other

## 2014-08-14 DIAGNOSIS — Z8679 Personal history of other diseases of the circulatory system: Secondary | ICD-10-CM | POA: Insufficient documentation

## 2014-08-14 DIAGNOSIS — N200 Calculus of kidney: Secondary | ICD-10-CM | POA: Diagnosis not present

## 2014-08-14 DIAGNOSIS — G43009 Migraine without aura, not intractable, without status migrainosus: Secondary | ICD-10-CM | POA: Diagnosis not present

## 2014-08-14 DIAGNOSIS — Z79899 Other long term (current) drug therapy: Secondary | ICD-10-CM | POA: Diagnosis not present

## 2014-08-14 DIAGNOSIS — J45909 Unspecified asthma, uncomplicated: Secondary | ICD-10-CM | POA: Diagnosis not present

## 2014-08-14 DIAGNOSIS — E039 Hypothyroidism, unspecified: Secondary | ICD-10-CM | POA: Diagnosis not present

## 2014-08-14 DIAGNOSIS — Z7982 Long term (current) use of aspirin: Secondary | ICD-10-CM | POA: Diagnosis not present

## 2014-08-14 DIAGNOSIS — E669 Obesity, unspecified: Secondary | ICD-10-CM | POA: Insufficient documentation

## 2014-08-14 DIAGNOSIS — I10 Essential (primary) hypertension: Secondary | ICD-10-CM | POA: Insufficient documentation

## 2014-08-14 DIAGNOSIS — M1990 Unspecified osteoarthritis, unspecified site: Secondary | ICD-10-CM | POA: Insufficient documentation

## 2014-08-14 DIAGNOSIS — R109 Unspecified abdominal pain: Secondary | ICD-10-CM | POA: Diagnosis present

## 2014-08-14 DIAGNOSIS — Z8659 Personal history of other mental and behavioral disorders: Secondary | ICD-10-CM | POA: Diagnosis not present

## 2014-08-14 LAB — COMPREHENSIVE METABOLIC PANEL
ALT: 31 U/L (ref 0–35)
AST: 23 U/L (ref 0–37)
Albumin: 3.8 g/dL (ref 3.5–5.2)
Alkaline Phosphatase: 106 U/L (ref 39–117)
Anion gap: 12 (ref 5–15)
BUN: 15 mg/dL (ref 6–23)
CALCIUM: 9.1 mg/dL (ref 8.4–10.5)
CO2: 26 mEq/L (ref 19–32)
Chloride: 101 mEq/L (ref 96–112)
Creatinine, Ser: 0.81 mg/dL (ref 0.50–1.10)
GFR calc non Af Amer: 71 mL/min — ABNORMAL LOW (ref >90)
GFR, EST AFRICAN AMERICAN: 83 mL/min — AB (ref >90)
GLUCOSE: 119 mg/dL — AB (ref 70–99)
Potassium: 4.1 mEq/L (ref 3.7–5.3)
SODIUM: 139 meq/L (ref 137–147)
Total Bilirubin: 0.5 mg/dL (ref 0.3–1.2)
Total Protein: 7.7 g/dL (ref 6.0–8.3)

## 2014-08-14 LAB — CBC WITH DIFFERENTIAL/PLATELET
BASOS ABS: 0.1 10*3/uL (ref 0.0–0.1)
BASOS PCT: 1 % (ref 0–1)
EOS PCT: 2 % (ref 0–5)
Eosinophils Absolute: 0.2 10*3/uL (ref 0.0–0.7)
HCT: 39.9 % (ref 36.0–46.0)
Hemoglobin: 13.4 g/dL (ref 12.0–15.0)
Lymphocytes Relative: 19 % (ref 12–46)
Lymphs Abs: 1.7 10*3/uL (ref 0.7–4.0)
MCH: 29 pg (ref 26.0–34.0)
MCHC: 33.6 g/dL (ref 30.0–36.0)
MCV: 86.4 fL (ref 78.0–100.0)
Monocytes Absolute: 0.8 10*3/uL (ref 0.1–1.0)
Monocytes Relative: 9 % (ref 3–12)
Neutro Abs: 6.6 10*3/uL (ref 1.7–7.7)
Neutrophils Relative %: 69 % (ref 43–77)
PLATELETS: 295 10*3/uL (ref 150–400)
RBC: 4.62 MIL/uL (ref 3.87–5.11)
RDW: 13 % (ref 11.5–15.5)
WBC: 9.4 10*3/uL (ref 4.0–10.5)

## 2014-08-14 LAB — URINALYSIS, ROUTINE W REFLEX MICROSCOPIC
Glucose, UA: NEGATIVE mg/dL
Ketones, ur: 15 mg/dL — AB
Leukocytes, UA: NEGATIVE
Nitrite: NEGATIVE
PROTEIN: 30 mg/dL — AB
Specific Gravity, Urine: 1.027 (ref 1.005–1.030)
UROBILINOGEN UA: 0.2 mg/dL (ref 0.0–1.0)
pH: 5 (ref 5.0–8.0)

## 2014-08-14 LAB — URINE MICROSCOPIC-ADD ON

## 2014-08-14 LAB — LIPASE, BLOOD: Lipase: 24 U/L (ref 11–59)

## 2014-08-14 MED ORDER — ONDANSETRON HCL 4 MG/2ML IJ SOLN
4.0000 mg | Freq: Once | INTRAMUSCULAR | Status: AC
  Administered 2014-08-14: 4 mg via INTRAVENOUS
  Filled 2014-08-14: qty 2

## 2014-08-14 MED ORDER — HYDROMORPHONE HCL PF 1 MG/ML IJ SOLN
0.5000 mg | Freq: Once | INTRAMUSCULAR | Status: AC
  Administered 2014-08-14: 0.5 mg via INTRAVENOUS
  Filled 2014-08-14: qty 1

## 2014-08-14 MED ORDER — HYDROMORPHONE HCL 2 MG PO TABS: 2.0000 mg | ORAL_TABLET | ORAL | Status: DC | PRN

## 2014-08-14 MED ORDER — FENTANYL CITRATE 0.05 MG/ML IJ SOLN: 100.0000 ug | Freq: Once | INTRAMUSCULAR | Status: DC

## 2014-08-14 MED ORDER — HYDROMORPHONE HCL PF 1 MG/ML IJ SOLN
1.0000 mg | Freq: Once | INTRAMUSCULAR | Status: AC
  Administered 2014-08-14: 1 mg via INTRAVENOUS
  Filled 2014-08-14: qty 1

## 2014-08-14 MED ORDER — KETOROLAC TROMETHAMINE 30 MG/ML IJ SOLN
15.0000 mg | Freq: Once | INTRAMUSCULAR | Status: AC
  Administered 2014-08-14: 15 mg via INTRAVENOUS
  Filled 2014-08-14: qty 1

## 2014-08-14 MED ORDER — TAMSULOSIN HCL 0.4 MG PO CAPS: 0.4000 mg | ORAL_CAPSULE | ORAL | Status: DC

## 2014-08-14 MED ORDER — TAMSULOSIN HCL 0.4 MG PO CAPS
0.4000 mg | ORAL_CAPSULE | ORAL | Status: AC
  Administered 2014-08-14: 0.4 mg via ORAL
  Filled 2014-08-14: qty 1

## 2014-08-14 MED ORDER — ONDANSETRON HCL 4 MG PO TABS: 4.0000 mg | ORAL_TABLET | Freq: Three times a day (TID) | ORAL | Status: DC | PRN

## 2014-08-14 MED ORDER — FENTANYL CITRATE 0.05 MG/ML IJ SOLN
100.0000 ug | Freq: Once | INTRAMUSCULAR | Status: AC
  Administered 2014-08-14: 50 ug via INTRAVENOUS
  Filled 2014-08-14: qty 2

## 2014-08-14 NOTE — ED Notes (Signed)
Pt o2 had dropped to 85% on room air. RN notified, pt put on 2L came up to 98%

## 2014-08-14 NOTE — ED Notes (Signed)
Rt flank pain fro 2 hours with rt  Lower abd pain difficulty  voiding

## 2014-08-14 NOTE — ED Provider Notes (Signed)
CSN: [id number]     Arrival date & time 08/14/14  1459 History   First MD Initiated Contact with Patient 08/14/14 1630     Chief Complaint  Patient presents with  . Flank Pain     (Consider location/radiation/quality/duration/timing/severity/associated sxs/prior Treatment) HPI Patient presents with right flank pain for 24-48 hours which got suddenly worse this afternoon.  Has had difficulty voiding.  No fever, chills.  Positive for nausea and vomiting.  Denies chest pain.  No previous history of kidney stones.  Patient denies hematuria. Past Medical History  Diagnosis Date  . ALLERGIC RHINITIS   . Asthma   . Hypertension   . Bronchiectasis   . Hyperlipidemia   . Hypothyroidism   . Obesity   . Palpitation   . Depression   . Migraine   . Pelvic fracture   . Migraine without aura, without mention of intractable migraine without mention of status migrainosus 09/13/2013  . Essential and other specified forms of tremor 09/13/2013  . Fibromyalgia   . Degenerative arthritis    Past Surgical History  Procedure Laterality Date  . Vesicovaginal fistula closure w/ tah  1984  . Gyn laparoscopy    . Nasal septum surgery    . Laparoscopic hysterectomy     Family History  Problem Relation Age of Onset  . Heart disease Brother   . Heart disease Mother   . Asthma Mother   . Kidney cancer Mother   . Cancer Mother   . Diabetes Mother   . Stroke Mother   . Tremor Mother   . Headache Mother   . Heart disease Sister   . COPD Father   . Stroke Father    History  Substance Use Topics  . Smoking status: Never Smoker   . Smokeless tobacco: Never Used  . Alcohol Use: No   OB History   Grav Para Term Preterm Abortions TAB SAB Ect Mult Living                 Review of Systems  All other systems reviewed and are negative  Allergies  Erythromycin; Codeine; Sulfonamide derivatives; and Tramadol  Home Medications   Prior to Admission medications   Medication Sig Start Date End  Date Taking? Authorizing Provider  aspirin 81 MG tablet Take 81 mg by mouth daily.   Yes Historical Provider, MD  Calcium Carbonate (CALCIUM 600 PO) Take 2 tablets by mouth daily.     Yes Historical Provider, MD  ergocalciferol (VITAMIN D2) 50000 UNITS capsule Take 1 tablet three days each week (m,w,f)   Yes Historical Provider, MD  furosemide (LASIX) 40 MG tablet Take 40 mg by mouth daily. If fluid retention take 1/2 tablet extra   Yes Historical Provider, MD  levalbuterol (XOPENEX HFA) 45 MCG/ACT inhaler Inhale 2 puffs into the lungs every 4 (four) hours as needed for wheezing.   Yes Historical Provider, MD  levalbuterol Pauline Aus) 1.25 MG/3ML nebulizer solution 1 neb up to 4 times daily as needed 08/08/13  Yes Waymon Budge, MD  levothyroxine (SYNTHROID, LEVOTHROID) 112 MCG tablet Take 112 mcg by mouth daily. Take 1/2 tab extra on Sundays   Yes Historical Provider, MD  loratadine (CLARITIN) 10 MG tablet Take 10 mg by mouth daily.   Yes Historical Provider, MD  LORazepam (ATIVAN) 1 MG tablet Take 1 mg by mouth 2 (two) times daily as needed for anxiety. 04/16/14  Yes Waymon Budge, MD  losartan (COZAAR) 50 MG tablet Take 50 mg by  mouth 2 (two) times daily.    Yes Historical Provider, MD  montelukast (SINGULAIR) 10 MG tablet Take 10 mg by mouth daily.   Yes Historical Provider, MD  Multiple Vitamin (MULTIVITAMIN) tablet Take 1 tablet by mouth daily.   Yes Historical Provider, MD  SUMAtriptan (IMITREX) 100 MG tablet Take 100 mg by mouth 2 (two) times daily as needed for migraine. May repeat in 2 hours if headache persists or recurs. 09/13/13  Yes York Spaniel, MD  HYDROmorphone (DILAUDID) 2 MG tablet Take 1 tablet (2 mg total) by mouth every 4 (four) hours as needed for severe pain. 08/14/14   Nelia Shi, MD  ondansetron (ZOFRAN) 4 MG tablet Take 1 tablet (4 mg total) by mouth every 8 (eight) hours as needed for nausea or vomiting. 08/14/14   Nelia Shi, MD  tamsulosin (FLOMAX) 0.4 MG CAPS  capsule Take 1 capsule (0.4 mg total) by mouth 1 day or 1 dose. 08/14/14   Nelia Shi, MD   BP 157/75  Pulse 68  Temp(Src) 97.8 F (36.6 C) (Oral)  Resp 18  SpO2 98% Physical Exam Physical Exam  Nursing note and vitals reviewed. Constitutional: She is oriented to person, place, and time. She appears well-developed and well-nourished. No distress.  HENT:  Head: Normocephalic and atraumatic.  Eyes: Pupils are equal, round, and reactive to light.  Neck: Normal range of motion.  Cardiovascular: Normal rate and intact distal pulses.   Pulmonary/Chest: No respiratory distress.  Abdominal: Normal appearance. She exhibits no distension.  no tenderness to palpation.  No rebound or guarding tenderness. Musculoskeletal: Normal range of motion.  Neurological: She is alert and oriented to person, place, and time. No cranial nerve deficit.  Skin: Skin is warm and dry. No rash noted.  Psychiatric: She has a normal mood and affect. Her behavior is normal.   ED Course  Procedures (including critical care time) Medications  fentaNYL (SUBLIMAZE) injection 100 mcg (not administered)  tamsulosin (FLOMAX) capsule 0.4 mg (not administered)  HYDROmorphone (DILAUDID) injection 0.5 mg (not administered)  fentaNYL (SUBLIMAZE) injection 100 mcg (50 mcg Intravenous Given 08/14/14 1711)  ondansetron (ZOFRAN) injection 4 mg (4 mg Intravenous Given 08/14/14 1710)  ketorolac (TORADOL) 30 MG/ML injection 15 mg (15 mg Intravenous Given 08/14/14 1710)  HYDROmorphone (DILAUDID) injection 1 mg (1 mg Intravenous Given 08/14/14 1832)    Labs Review Labs Reviewed  COMPREHENSIVE METABOLIC PANEL - Abnormal; Notable for the following:    Glucose, Bld 119 (*)    GFR calc non Af Amer 71 (*)    GFR calc Af Amer 83 (*)    All other components within normal limits  URINALYSIS, ROUTINE W REFLEX MICROSCOPIC - Abnormal; Notable for the following:    Color, Urine AMBER (*)    APPearance CLOUDY (*)    Hgb urine dipstick LARGE  (*)    Bilirubin Urine SMALL (*)    Ketones, ur 15 (*)    Protein, ur 30 (*)    All other components within normal limits  URINE MICROSCOPIC-ADD ON - Abnormal; Notable for the following:    Squamous Epithelial / LPF MANY (*)    Bacteria, UA FEW (*)    Crystals CA OXALATE CRYSTALS (*)    All other components within normal limits  CBC WITH DIFFERENTIAL  LIPASE, BLOOD    Imaging Review Ct Renal Stone Study  08/14/2014   CLINICAL DATA:  Hematuria and right flank pain.  EXAM: CT RENAL STONE PROTOCOL  TECHNIQUE: Multidetector CT  imaging of the abdomen and pelvis was performed following the standard protocol without intravenous contrast  COMPARISON:  None currently available  FINDINGS: BODY WALL: Negative appearing  LOWER CHEST: Unremarkable.  ABDOMEN/PELVIS:  Liver: Hepatic steatosis.  Biliary: No evidence of biliary obstruction or stone.  Pancreas: Unremarkable.  Spleen: Unremarkable.  Adrenals: Unremarkable.  Kidneys and ureters: 4 mm calculus at the right ureteral vesicular junction with proximal hydronephrosis and hydroureter. Asymmetric right perinephric edema. There is a 3 mm calculus in the interpolar right kidney, nonobstructive but associated with cortical thinning/scarring.  Bladder: Unremarkable.  Reproductive: Hysterectomy.  The ovaries are unremarkable for age.  Bowel: No obstruction. Negative appendix.  Retroperitoneum: No mass or adenopathy.  Peritoneum: No ascites or pneumoperitoneum.  Vascular: No acute abnormality.  OSSEOUS: No acute abnormalities. Remote left obturator ring fracture.    IMPRESSION: 1. Obstructing 4 mm calculus at the right UVJ. 2. 3 mm right nephrolithiasis.   Electronically Signed   By: Tiburcio Pea M.D.   On: 08/14/2014 18:12     EKG Interpretation None     After treatment in the ED the patient feels back to baseline and wants to go home. MDM   Final diagnoses:  Kidney stone        Nelia Shi, MD 08/14/14 1958

## 2014-08-14 NOTE — Discharge Instructions (Signed)

## 2014-09-17 ENCOUNTER — Ambulatory Visit: Payer: Medicare Other | Admitting: Neurology

## 2014-10-10 ENCOUNTER — Ambulatory Visit (INDEPENDENT_AMBULATORY_CARE_PROVIDER_SITE_OTHER): Payer: Medicare Other | Admitting: Internal Medicine

## 2014-10-10 ENCOUNTER — Ambulatory Visit: Payer: Medicare Other | Admitting: Internal Medicine

## 2014-10-10 ENCOUNTER — Encounter: Payer: Self-pay | Admitting: Internal Medicine

## 2014-10-10 VITALS — BP 168/80 | HR 88 | Ht 61.0 in | Wt 216.6 lb

## 2014-10-10 DIAGNOSIS — J3089 Other allergic rhinitis: Secondary | ICD-10-CM

## 2014-10-10 DIAGNOSIS — J309 Allergic rhinitis, unspecified: Secondary | ICD-10-CM

## 2014-10-10 DIAGNOSIS — J449 Chronic obstructive pulmonary disease, unspecified: Secondary | ICD-10-CM

## 2014-10-10 DIAGNOSIS — J302 Other seasonal allergic rhinitis: Secondary | ICD-10-CM

## 2014-10-10 MED ORDER — IPRATROPIUM BROMIDE HFA 17 MCG/ACT IN AERS: INHALATION_SPRAY | RESPIRATORY_TRACT | Status: DC

## 2014-10-10 MED ORDER — LORAZEPAM 1 MG PO TABS: 1.0000 mg | ORAL_TABLET | Freq: Two times a day (BID) | ORAL | Status: AC | PRN

## 2014-10-10 MED ORDER — MONTELUKAST SODIUM 10 MG PO TABS: 10.0000 mg | ORAL_TABLET | Freq: Every day | ORAL | Status: DC

## 2014-10-10 MED ORDER — LEVALBUTEROL HCL 1.25 MG/3ML IN NEBU: INHALATION_SOLUTION | RESPIRATORY_TRACT | Status: DC

## 2014-10-10 NOTE — Progress Notes (Signed)
Patient ID: Brandy Oconnell, female    DOB: 12/26/1940, 73 y.o.   MRN: [id number]  HPI 07/29/11-73 year old never smoker followed for asthma, Allergic rhinitis, bronchitis Last here January 28, 2011 -note reviewed Noticing some sneezing in last few days- seasonal. Asthmatic bronchitis has been significantly improved by resumption of Singulair. Rescue inhaler is not needed often, less than once per week. Continues aware of palpitation without change and without chest pain or syncope. 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 Ankle edema improved significantly as Norvasc dose reduced.  02/03/12- 73 year old never smoker followed for asthma, Allergic rhinitis, bronchitis Nasal stuffiness. Sudafed caused palpitations. Allegra caused nausea. She prefers Zyrtec.. Does saline nasal rinse. Not wheezing now. Does have a nebulizer with Xopenex for use if needed.  08/08/12- 73 year old never smoker followed for asthma, Allergic rhinitis, bronchitis Starting to have slight runny nose due to ragweed. Daily Singulair prevents coughing she denies wheeze. Occasionally needs her rescue Xopenex inhaler, less than once per week. 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  02/08/13- 73 year old never smoker followed for asthma, Allergic rhinitis, bronchitis FOLLOWS FOR: sneezing alot since last visit; slight wheezing yesterday, as long as on singulair no cough. Nasal stuffiness. Little wheeze. She reminds me of surgical history-turbinate reduction. Mobility has been limited by arthritis pain and knee. Has had peripheral edema with some stasis changes lower extremities. CXR 09/01/10 IMPRESSION:  1. Bilateral peribronchial thickening. This can be seen in the  setting of asthma, smoking, or viral infection, and may be chronic.  2. No airspace disease.  3. Borderline cardiomegaly.  Provider:  Darrelyn HillockKristin Oconnell  08/08/13- 73 year old never smoker followed for asthma, Allergic rhinitis, bronchitis FOLLOWS FOR: humid days patient has to stay indoors as much as possible due to being unable ot breathe. Pt has started to sneeze. Has started seasonal ragweed sniffing-antihistamine. Needs daily Singulair to prevent coughing. Home being treated for bed bugs CXR 02/08/13- IMPRESSION:  Bronchitic changes with minimal right basilar atelectasis.  Original Report Authenticated By: Ulyses SouthwardMark Boles, M.D.  10/09/13-  73 year old never smoker followed for asthma/ chronic bronchitis, Allergic rhinitis,  FOLLOWS FOR:c/o runny nose,sneezing x 1 wk. worse,no sob,denies cough or wheeze Depending on loratadine and singulair. C/o watery nose and wakes w/ bloody crusting. Sneezing, no HA. No wheeze. Uses nebulizer very occasionally.  Discussed sleep- frequent night mares- asks lorazepam refill.  04/09/14- 73 year old never smoker followed for asthma/ chronic bronchitis, Allergic rhinitis, complicated by IBS  FOLLOWS FOR:  Sinus pressure, PND, sneezing and itchy water eyes External otitis responded to Cipro ointment from Dr. [place] [place]. Saw an oral surgeon for tooth abscess and took amoxicillin for 2 weeks. Allergic rhinitis symptoms-daily Claritin. Can't stay outside without rhinorrhea. Continues Singulair but says Allegra and Zyrtec causes nausea.Have overdrying her to the point of epistaxis. Occasional minor wheeze around cut flowers a grocery store, perfumes. Sleep-she was taking Ativan to suppress nightmares but doesn't seem to work. Taking Norpramin 50 mg for irritable bowel.  10/10/14- 73 year old never smoker followed for asthma/ chronic bronchitis, Allergic rhinitis, complicated by IBS, hx kidney stone FOLLOWS FOR: patient was just a little upset that her appointment had been calncelled apparently by another office. We were able to see her. Throat tickle, light cough. Albuterol HFA too stimulating. Can try  Atrovent HFA. Discussed her albuterol neb solution.  Review of Systems- see HPI Constitutional:   No-   weight loss, night sweats,  fevers, chills,   fatigue, lassitude. HEENT:   No-  headaches, difficulty swallowing, tooth/dental problems, sore throat,       +-  sneezing, itching, ear ache,+nasal congestion, post nasal drip,  CV:  No-   chest pain, orthopnea, PND, + swelling in lower extremities, No- anasarca, dizziness,                + palpitations Resp: + shortness of breath with exertion or at rest.              No-   productive cough,  No non-productive cough,  No-  coughing up of blood.              No-   change in color of mucus.  + wheezing.   Skin: No-   rash or lesions. GI:  No-   heartburn, indigestion, abdominal pain, nausea, vomiting,  GU:  MS:  +  joint pain or swelling. . Neuro- nothing unusual  Psych:  No- change in mood or affect. No depression or anxiety.  No memory loss.    Objective:   Physical Exam General- Alert, Oriented, Affect-guarded, Distress- none acute;  overweight Skin- rash-none, lesions- none, excoriation- none Lymphadenopathy- none Head- atraumatic            Eyes- Gross vision intact, PERRLA, conjunctivae clear secretions            Ears- Hearing- normal            Nose- + mild turbinate edema, No-Septal dev, mucus, polyps, erosion, perforation             Throat- Mallampati III , mucosa clear , drainage- none, tonsils- atrophic Neck- flexible , trachea midline, no stridor , thyroid nl, carotid no bruit Chest - symmetrical excursion , unlabored           Heart/CV- RRR , no murmur , no gallop , no rub, nl s1 s2                           - JVD- none , edema+1, stasis changes+, varices- none           Lung- clear, unlabored, wheeze- none, cough- none , dullness-none, rub- none           Chest wall-  Abd-  Br/ Gen/ Rectal- Not done, not indicated Extrem- cyanosis- none, clubbing, none, atrophy- none, strength- nl Neuro- grossly intact to  observation

## 2014-10-10 NOTE — Patient Instructions (Signed)
Script sent refilling xopenex neb solution  And Singulair  Script to drug store for atrovent metered inhaler to try as a rescue inhaler  Script printed for ativan

## 2014-10-11 ENCOUNTER — Telehealth: Payer: Self-pay | Admitting: Internal Medicine

## 2014-10-11 ENCOUNTER — Telehealth: Payer: Self-pay | Admitting: Cardiovascular Disease

## 2014-10-11 NOTE — Telephone Encounter (Signed)
Spoke with pt on 10/29 and she stated that someone from our office had cancelled her appt with Dr. Joni Fearslinton on 10/29 instead of rescheduling her with Dr. Salena Saner on 12/4 which she would not be able to make it to.I sincerely apologized numerous times and offered to reschedule or cancel her appt with Dr. Salena Saner. She hung up on me.     She called back about 15 mins later and wanted to speak to the office manager. I told her that the office manager was unavailable  and would she like to leave her a voice message. She declined and stated that she would call back the next day.   Pt called back this morning wanting to speak with the office manager, I spoke with [facility] and brought her up to date on the situation and thought it would be best if I forwarded the call to [facility] PsKay Fogelman.

## 2014-10-11 NOTE — Telephone Encounter (Signed)
lmomtcb x1 

## 2014-10-13 NOTE — Assessment & Plan Note (Signed)
Usually controlled with antihistamine

## 2014-10-13 NOTE — Assessment & Plan Note (Signed)
Does not tolerate stimulant bronchodilator well for rescue purposes. She can use her nebulizer machine by splitting the dose. Considered Xopenex and Atrovent for rescue inhaler purposes. We can see if her insurance will cover Atrovent. Plan-try Atrovent rescue inhaler, refill Ativan for use to control stimulant symptoms if necessary, refill nebulizer solution

## 2014-10-14 MED ORDER — ALBUTEROL SULFATE HFA 108 (90 BASE) MCG/ACT IN AERS: 2.0000 | INHALATION_SPRAY | Freq: Four times a day (QID) | RESPIRATORY_TRACT | Status: DC | PRN

## 2014-10-14 NOTE — Telephone Encounter (Signed)
Sorry that Atrovent inhaler is so expensive. The problem with albuterol inhaler is over stimulation. Suggest trying to use 1 puff as the dose, instead of the usual 2 puffs. Can repeat every 4-6 hours when needed as rescue inhaler.

## 2014-10-14 NOTE — Telephone Encounter (Signed)
Called and spoke with pt and she stated that she is not able to afford the atrovent---her co-pay is $100 and she cannot afford this.  Is there any other medication to replace the atrovent?  CY please advise. Thanks  Allergies  Allergen Reactions  . Erythromycin   . Codeine   . Sulfonamide Derivatives   . Tramadol     REACTION: Nightmares    Current Outpatient Prescriptions on File Prior to Visit  Medication Sig Dispense Refill  . aspirin 81 MG tablet Take 81 mg by mouth daily.    . Calcium Carbonate (CALCIUM 600 PO) Take 2 tablets by mouth daily.      . furosemide (LASIX) 40 MG tablet Take 40 mg by mouth daily. If fluid retention take 1/2 tablet extra    . ipratropium (ATROVENT HFA) 17 MCG/ACT inhaler 2 puffs every 6 hours- rescue inhaler 1 Inhaler 12  . levalbuterol (XOPENEX) 1.25 MG/3ML nebulizer solution 1 neb up to 4 times daily as needed 225 mL 3  . levothyroxine (SYNTHROID, LEVOTHROID) 112 MCG tablet Take 112 mcg by mouth daily. Take 1/2 tab extra on Sundays    . loratadine (CLARITIN) 10 MG tablet Take 10 mg by mouth daily.    Marland Kitchen. LORazepam (ATIVAN) 1 MG tablet Take 1 tablet (1 mg total) by mouth 2 (two) times daily as needed for anxiety. 30 tablet 5  . losartan (COZAAR) 50 MG tablet Take 50 mg by mouth 2 (two) times daily.     . montelukast (SINGULAIR) 10 MG tablet Take 1 tablet (10 mg total) by mouth daily. 90 tablet 3  . SUMAtriptan (IMITREX) 100 MG tablet Take 100 mg by mouth 2 (two) times daily as needed for migraine. May repeat in 2 hours if headache persists or recurs.    . Vitamin D, Cholecalciferol, 1000 UNITS TABS Take 1 tablet by mouth daily.     No current facility-administered medications on file prior to visit.

## 2014-10-14 NOTE — Telephone Encounter (Signed)
Spoke with pt, she is aware of recs.  Refill for albuterol sent to pharmacy.  Nothing further needed.

## 2014-10-15 ENCOUNTER — Telehealth: Payer: Self-pay | Admitting: Internal Medicine

## 2014-10-15 MED ORDER — MONTELUKAST SODIUM 10 MG PO TABS: 10.0000 mg | ORAL_TABLET | Freq: Every day | ORAL | Status: DC

## 2014-10-15 NOTE — Telephone Encounter (Signed)
Called pt. She needs her singulair sent to [facility]. Nothing further needed

## 2014-10-30 ENCOUNTER — Encounter: Payer: Self-pay | Admitting: Neurology

## 2014-11-05 ENCOUNTER — Encounter: Payer: Self-pay | Admitting: Neurology

## 2014-11-15 ENCOUNTER — Ambulatory Visit: Payer: Medicare Other | Admitting: Cardiovascular Disease

## 2014-11-26 ENCOUNTER — Telehealth: Payer: Self-pay | Admitting: Cardiovascular Disease

## 2014-11-26 NOTE — Telephone Encounter (Signed)
Close encounter 

## 2014-11-27 ENCOUNTER — Ambulatory Visit: Payer: Medicare Other | Admitting: Internal Medicine

## 2014-11-28 ENCOUNTER — Ambulatory Visit: Payer: Medicare Other | Admitting: Cardiovascular Disease

## 2014-12-01 ENCOUNTER — Telehealth: Payer: Self-pay | Admitting: Internal Medicine

## 2014-12-01 DIAGNOSIS — J4520 Mild intermittent asthma, uncomplicated: Secondary | ICD-10-CM

## 2014-12-01 NOTE — Telephone Encounter (Signed)
Ok to order DME (Medicare) new nebulizer compressor and albuterol 0.083 nebv solution, # 75 ml, 1 neb every 6 hours if needed, refill prn    For dx asthma mild intermittent

## 2014-12-02 NOTE — Addendum Note (Signed)
Addended by: Maisie FusGREEN, Sandee Bernath M on: 12/02/2014 10:05 AM   Modules accepted: Orders

## 2014-12-02 NOTE — Telephone Encounter (Signed)
Pt aware that order for Nebulizer has been placed. Pt states that she does not need nebulizer meds at this time-will cal when she needs a refill. Nothing further needed.

## 2015-01-03 ENCOUNTER — Ambulatory Visit: Payer: Medicare Other | Admitting: Neurology

## 2015-01-15 ENCOUNTER — Ambulatory Visit (INDEPENDENT_AMBULATORY_CARE_PROVIDER_SITE_OTHER): Payer: Medicare Other | Admitting: Cardiovascular Disease

## 2015-01-15 VITALS — BP 142/78 | HR 75 | Resp 16 | Ht 62.0 in | Wt 210.5 lb

## 2015-01-15 DIAGNOSIS — I159 Secondary hypertension, unspecified: Secondary | ICD-10-CM

## 2015-01-15 DIAGNOSIS — E785 Hyperlipidemia, unspecified: Secondary | ICD-10-CM

## 2015-01-15 DIAGNOSIS — Z79899 Other long term (current) drug therapy: Secondary | ICD-10-CM

## 2015-01-15 DIAGNOSIS — I10 Essential (primary) hypertension: Secondary | ICD-10-CM

## 2015-01-15 MED ORDER — ROSUVASTATIN CALCIUM 10 MG PO TABS: 10.0000 mg | ORAL_TABLET | ORAL | Status: DC

## 2015-01-15 MED ORDER — VALSARTAN 320 MG PO TABS: 320.0000 mg | ORAL_TABLET | Freq: Every day | ORAL | Status: DC

## 2015-01-15 NOTE — Patient Instructions (Signed)
START Crestor 10mg  once a week.  STOP Losartan.  START Valsartan 320mg  daily.  Your physician recommends that you return for lab work in: FASTING at your convenience.  Dr. Royann Shiversroitoru recommends that you schedule a follow-up appointment in: One year.

## 2015-01-17 ENCOUNTER — Encounter: Payer: Self-pay | Admitting: Cardiovascular Disease

## 2015-01-17 NOTE — Progress Notes (Signed)
Patient ID: Brandy Oconnell, female   DOB: 09/23/1941, 74 y.o.   MRN: [id number]     Reason for office visit Hyperlipidemia, hypertension  Bonita QuinLinda returns for routine follow-up and has not had new cardiac problems since her last appointment. She remains moderate to severely obese with a BMI over 38. She has hyperlipidemia and hypertension. She developed nephrolithiasis with calcium oxalate stones and is struggling to drink enough lemon juice as recommended by her urologist.  Her blood pressure is borderline high at 142/78, with similar readings at home. She does not take thiazide diuretics. She rarely requires an additional dose of loop diuretic for ankle edema since her retirement.  Recent labs showed, as before, mixed hyperlipidemia with elevated total cholesterol triglycerides and LDL cholesterol. She has tried taking statins and Zetia and has not tolerated these medications (statins tried have included daily Crestor, daily pravastatin and Vytorin). She had recent normal TSH and her creatinine was 0.9. Allergies  Allergen Reactions  . Erythromycin   . Codeine   . Sulfonamide Derivatives   . Tramadol     REACTION: Nightmares    Current Outpatient Prescriptions  Medication Sig Dispense Refill  . albuterol (PROVENTIL HFA;VENTOLIN HFA) 108 (90 BASE) MCG/ACT inhaler Inhale 2 puffs into the lungs every 6 (six) hours as needed for wheezing or shortness of breath. 1 Inhaler 6  . aspirin 81 MG tablet Take 81 mg by mouth daily.    . Calcium Carbonate (CALCIUM 600 PO) Take 2 tablets by mouth daily.      . furosemide (LASIX) 40 MG tablet Take 40 mg by mouth daily. If fluid retention take 1/2 tablet extra    . levalbuterol (XOPENEX) 1.25 MG/3ML nebulizer solution 1 neb up to 4 times daily as needed 225 mL 3  . levothyroxine (SYNTHROID, LEVOTHROID) 112 MCG tablet Take 112 mcg by mouth daily.     Marland Kitchen. loratadine (CLARITIN) 10 MG tablet Take 10 mg by mouth daily.    Marland Kitchen. LORazepam (ATIVAN) 1 MG tablet Take 1  tablet (1 mg total) by mouth 2 (two) times daily as needed for anxiety. 30 tablet 5  . montelukast (SINGULAIR) 10 MG tablet Take 1 tablet (10 mg total) by mouth daily. 90 tablet 3  . SUMAtriptan (IMITREX) 100 MG tablet Take 100 mg by mouth 2 (two) times daily as needed for migraine. May repeat in 2 hours if headache persists or recurs.    . Vitamin D, Cholecalciferol, 1000 UNITS TABS Take 5,000 Units by mouth daily.     . rosuvastatin (CRESTOR) 10 MG tablet Take 1 tablet (10 mg total) by mouth every 7 (seven) days. 25 tablet 3  . valsartan (DIOVAN) 320 MG tablet Take 1 tablet (320 mg total) by mouth daily. 90 tablet 3   No current facility-administered medications for this visit.    Past Medical History  Diagnosis Date  . ALLERGIC RHINITIS   . Asthma   . Hypertension   . Bronchiectasis   . Hyperlipidemia   . Hypothyroidism   . Obesity   . Palpitation   . Depression   . Migraine   . Pelvic fracture   . Migraine without aura, without mention of intractable migraine without mention of status migrainosus 09/13/2013  . Essential and other specified forms of tremor 09/13/2013  . Fibromyalgia   . Degenerative arthritis     Past Surgical History  Procedure Laterality Date  . Vesicovaginal fistula closure w/ tah  1984  . Gyn laparoscopy    . Nasal  septum surgery    . Laparoscopic hysterectomy      Family History  Problem Relation Age of Onset  . Heart disease Brother   . Heart disease Mother   . Asthma Mother   . Kidney cancer Mother   . Cancer Mother   . Diabetes Mother   . Stroke Mother   . Tremor Mother   . Headache Mother   . Heart disease Sister   . COPD Father   . Stroke Father     History   Social History  . Marital Status: Divorced    Spouse Name: N/A    Number of Children: 0  . Years of Education: COLLEGE   Occupational History  . full time clinical lab tech    Social History Main Topics  . Smoking status: Never Smoker   . Smokeless tobacco: Never Used    . Alcohol Use: No  . Drug Use: No  . Sexual Activity: Not on file   Other Topics Concern  . Not on file   Social History Narrative    Review of systems: The patient specifically denies any chest pain at rest or with exertion, dyspnea at rest or with exertion, orthopnea, paroxysmal nocturnal dyspnea, syncope, palpitations, focal neurological deficits, intermittent claudication, lower extremity edema, unexplained weight gain, cough, hemoptysis or wheezing.  The patient also denies abdominal pain, nausea, vomiting, dysphagia, diarrhea, constipation, polyuria, polydipsia, dysuria, hematuria, frequency, urgency, abnormal bleeding or bruising, fever, chills, unexpected weight changes, mood swings, change in skin or hair texture, change in voice quality, auditory or visual problems, allergic reactions or rashes, new musculoskeletal complaints other than usual "aches and pains".   PHYSICAL EXAM BP 142/78 mmHg  Pulse 75  Resp 16  Ht  (1.575 m)  Wt 95.482 kg (210 lb 8 oz)  BMI 38.49 kg/m2  General: Alert, oriented x3, no distress Head: no evidence of trauma, PERRL, EOMI, no exophtalmos or lid lag, no myxedema, no xanthelasma; normal ears, nose and oropharynx Neck: normal jugular venous pulsations and no hepatojugular reflux; brisk carotid pulses without delay and no carotid bruits Chest: clear to auscultation, no signs of consolidation by percussion or palpation, normal fremitus, symmetrical and full respiratory excursions Cardiovascular: normal position and quality of the apical impulse, regular rhythm, normal first and second heart sounds, no murmurs, rubs or gallops Abdomen: no tenderness or distention, no masses by palpation, no abnormal pulsatility or arterial bruits, normal bowel sounds, no hepatosplenomegaly Extremities: no clubbing, cyanosis or edema; 2+ radial, ulnar and brachial pulses bilaterally; 2+ right femoral, posterior tibial and dorsalis pedis pulses; 2+ left femoral,  posterior tibial and dorsalis pedis pulses; no subclavian or femoral bruits Neurological: grossly nonfocal   EKG: Sinus rhythm possible left atrial enlargement poor R-wave progression no repolarization abnormalities  Lipid Panel  Cholesterol 247, triglycerides 219, HDL 47, LDL 156, glucose 91, creatinine 0.9, normal TSH  BMET    Component Value Date/Time   NA 139 08/14/2014 1514   K 4.1 08/14/2014 1514   CL 101 08/14/2014 1514   CO2 26 08/14/2014 1514   GLUCOSE 119* 08/14/2014 1514   BUN 15 08/14/2014 1514   CREATININE 0.81 08/14/2014 1514   CALCIUM 9.1 08/14/2014 1514   GFRNONAA 71* 08/14/2014 1514   GFRAA 83* 08/14/2014 1514     ASSESSMENT AND PLAN  Blood pressure control is fair if not perfect. Weight loss is again strongly recommended. Daily exercise will be important. She is willing to try taking once weekly Crestor 10 mg  to see if this will be tolerable and lead to a meaningful reduction in her cholesterol and LDL cholesterol. Previously she has had normal coronaries by angiography. I don't think she has the burden of vascular disease that would lead to an indication for PCS K9 inhibitors.   Orders Placed This Encounter  Procedures  . Comprehensive metabolic panel  . Lipid panel  . EKG 12-Lead   Meds ordered this encounter  Medications  . valsartan (DIOVAN) 320 MG tablet    Sig: Take 1 tablet (320 mg total) by mouth daily.    Dispense:  90 tablet    Refill:  3  . rosuvastatin (CRESTOR) 10 MG tablet    Sig: Take 1 tablet (10 mg total) by mouth every 7 (seven) days.    Dispense:  25 tablet    Refill:  3    Georgine Wiltse  Thurmon Fair, MD, [facility] [phone] office [phone] pager

## 2015-04-10 ENCOUNTER — Ambulatory Visit (INDEPENDENT_AMBULATORY_CARE_PROVIDER_SITE_OTHER): Payer: Medicare Other | Admitting: Internal Medicine

## 2015-04-10 ENCOUNTER — Encounter: Payer: Self-pay | Admitting: Internal Medicine

## 2015-04-10 VITALS — BP 148/82 | HR 66 | Ht 61.0 in | Wt 211.4 lb

## 2015-04-10 DIAGNOSIS — J45998 Other asthma: Secondary | ICD-10-CM | POA: Diagnosis not present

## 2015-04-10 DIAGNOSIS — J309 Allergic rhinitis, unspecified: Secondary | ICD-10-CM | POA: Diagnosis not present

## 2015-04-10 DIAGNOSIS — J3089 Other allergic rhinitis: Principal | ICD-10-CM

## 2015-04-10 DIAGNOSIS — J302 Other seasonal allergic rhinitis: Secondary | ICD-10-CM

## 2015-04-10 NOTE — Patient Instructions (Addendum)
Sample Dymista nasal spray  1-2 puffs each nostril once daily at bedtime  If you don't like Dymista, try going back to otc Nasalcrom  Ok to try Singulair off and on, a week at a time, to see if it is doing you any good.  Please call if we can help

## 2015-04-10 NOTE — Progress Notes (Signed)
Patient ID: TANAKA GILLEN, female    DOB: August 26, 1941, 74 y.o.   MRN: [id number]  HPI 07/29/11-50 year old never smoker followed for asthma, Allergic rhinitis, bronchitis Last here January 28, 2011 -note reviewed Noticing some sneezing in last few days- seasonal. Asthmatic bronchitis has been significantly improved by resumption of Singulair. Rescue inhaler is not needed often, less than once per week. Continues aware of palpitation without change and without chest pain or syncope. 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 Ankle edema improved significantly as Norvasc dose reduced.  02/03/12- 77 year old never smoker followed for asthma, Allergic rhinitis, bronchitis Nasal stuffiness. Sudafed caused palpitations. Allegra caused nausea. She prefers Zyrtec.. Does saline nasal rinse. Not wheezing now. Does have a nebulizer with Xopenex for use if needed.  08/08/12- 58 year old never smoker followed for asthma, Allergic rhinitis, bronchitis Starting to have slight runny nose due to ragweed. Daily Singulair prevents coughing she denies wheeze. Occasionally needs her rescue Xopenex inhaler, less than once per week. 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  02/08/13- 68 year old never smoker followed for asthma, Allergic rhinitis, bronchitis FOLLOWS FOR: sneezing alot since last visit; slight wheezing yesterday, as long as on singulair no cough. Nasal stuffiness. Little wheeze. She reminds me of surgical history-turbinate reduction. Mobility has been limited by arthritis pain and knee. Has had peripheral edema with some stasis changes lower extremities. CXR 09/01/10 IMPRESSION:  1. Bilateral peribronchial thickening. This can be seen in the  setting of asthma, smoking, or viral infection, and may be chronic.  2. No airspace disease.  3. Borderline cardiomegaly.  Provider:  Darrelyn Hillock  08/08/13- 89 year old never smoker followed for asthma, Allergic rhinitis, bronchitis FOLLOWS FOR: humid days patient has to stay indoors as much as possible due to being unable ot breathe. Pt has started to sneeze. Has started seasonal ragweed sniffing-antihistamine. Needs daily Singulair to prevent coughing. Home being treated for bed bugs CXR 02/08/13- IMPRESSION:  Bronchitic changes with minimal right basilar atelectasis.  Original Report Authenticated By: Ulyses Southward, M.D.  10/09/13-  35 year old never smoker followed for asthma/ chronic bronchitis, Allergic rhinitis,  FOLLOWS FOR:c/o runny nose,sneezing x 1 wk. worse,no sob,denies cough or wheeze Depending on loratadine and singulair. C/o watery nose and wakes w/ bloody crusting. Sneezing, no HA. No wheeze. Uses nebulizer very occasionally.  Discussed sleep- frequent night mares- asks lorazepam refill.  04/09/14- 40 year old never smoker followed for asthma/ chronic bronchitis, Allergic rhinitis, complicated by IBS  FOLLOWS FOR:  Sinus pressure, PND, sneezing and itchy water eyes External otitis responded to Cipro ointment from Dr. Evlyn Kanner. Saw an oral surgeon for tooth abscess and took amoxicillin for 2 weeks. Allergic rhinitis symptoms-daily Claritin. Can't stay outside without rhinorrhea. Continues Singulair but says Allegra and Zyrtec causes nausea.Have overdrying her to the point of epistaxis. Occasional minor wheeze around cut flowers a grocery store, perfumes. Sleep-she was taking Ativan to suppress nightmares but doesn't seem to work. Taking Norpramin 50 mg for irritable bowel.  10/10/14- 77 year old never smoker followed for asthma/ chronic bronchitis, Allergic rhinitis, complicated by IBS, hx kidney stone FOLLOWS FOR: patient was just a little upset that her appointment had been calncelled apparently by another office. We were able to see her. Throat tickle, light cough. Albuterol HFA too stimulating. Can try  Atrovent HFA. Discussed her albuterol neb solution.  04/10/15- 33 year old never smoker followed for asthma/ chronic bronchitis, Allergic rhinitis, complicated by IBS, hx  kidney stone FOLLOWS FOR:c/o allergies,staying inside,sneezing,runny nose-clear,sorethroat occass., headache daily,cough occass. dry around heavy scents.denies sob Periorbital puffiness. We discussed history of intense redness and pain as local reaction to pneumonia vaccine-23 around 2008 with recommendation that she not get Prevnar vaccine.  Review of Systems- see HPI Constitutional:   No-   weight loss, night sweats, fevers, chills,   fatigue, lassitude. HEENT:   No-  headaches, difficulty swallowing, tooth/dental problems, sore throat,       +-  sneezing, itching, ear ache,+nasal congestion, post nasal drip,  CV:  No-   chest pain, orthopnea, PND, + swelling in lower extremities, No- anasarca, dizziness,                + palpitations Resp: + shortness of breath with exertion or at rest.              No-   productive cough,  No non-productive cough,  No-  coughing up of blood.              No-   change in color of mucus.  + wheezing.   Skin: No-   rash or lesions. GI:  No-   heartburn, indigestion, abdominal pain, nausea, vomiting,  GU:  MS:  +  joint pain or swelling. . Neuro- nothing unusual  Psych:  No- change in mood or affect. No depression or anxiety.  No memory loss.    Objective:   Physical Exam General- Alert, Oriented, Affect-guarded, Distress- none acute;                     +overweight Skin- rash-none, lesions- none, excoriation- none Lymphadenopathy- none Head- atraumatic            Eyes- Gross vision intact, PERRLA, conjunctivae clear secretions, + periorbital edema            Ears- Hearing- normal            Nose- + mild turbinate edema, No-Septal dev, mucus, polyps, erosion, perforation             Throat- Mallampati III , mucosa clear , drainage- none, tonsils- atrophic Neck- flexible , trachea  midline, no stridor , thyroid nl, carotid no bruit Chest - symmetrical excursion , unlabored           Heart/CV- RRR , no murmur , no gallop , no rub, nl s1 s2                           - JVD- none , edema+ trace, stasis changes+, varices- none           Lung- clear, unlabored, wheeze- none, cough- none , dullness-none, rub- none           Chest wall-  Abd-  Br/ Gen/ Rectal- Not done, not indicated Extrem- cyanosis- none, clubbing, none, atrophy- none, strength- nl Neuro- grossly intact to observation

## 2015-04-11 NOTE — Assessment & Plan Note (Signed)
I'll controlled, not needing rescue inhaler recently. She has been taking Singulair but can't tell if it helps. Plan-try off and on Singulair a week at a time and stop it if no apparent difference.

## 2015-04-11 NOTE — Assessment & Plan Note (Signed)
Mild seasonal exacerbation of rhinitis Plan-antihistamine as needed. Try sample Dymista nasal spray

## 2015-05-23 ENCOUNTER — Telehealth: Payer: Self-pay | Admitting: Internal Medicine

## 2015-05-23 MED ORDER — PREDNISONE 10 MG PO TABS: ORAL_TABLET | ORAL | Status: DC

## 2015-05-23 NOTE — Telephone Encounter (Signed)
Persistent hard dry cough. On amoxacillin for UTI Asked prednisone- discussed. Plan- prednisone through weekend then reassess.

## 2015-05-26 ENCOUNTER — Telehealth: Payer: Self-pay | Admitting: Internal Medicine

## 2015-05-26 NOTE — Telephone Encounter (Signed)
On prednisone 10 mg- 2 daily for 3 days for bronchitis, weakness while also on amoxacillin for dental problem. Dropped to prednisone 10 mg daily x 2 days and again hoarse and weak. Plan- Prednisone 20 mg daily x 5 more days, then 10 mg daily, using remainder of current supply.

## 2015-06-20 ENCOUNTER — Other Ambulatory Visit: Payer: Self-pay

## 2015-06-20 ENCOUNTER — Other Ambulatory Visit: Payer: Self-pay | Admitting: Endocrinology

## 2015-06-20 DIAGNOSIS — M8580 Other specified disorders of bone density and structure, unspecified site: Secondary | ICD-10-CM

## 2015-06-20 DIAGNOSIS — Z1231 Encounter for screening mammogram for malignant neoplasm of breast: Secondary | ICD-10-CM

## 2015-07-01 ENCOUNTER — Encounter: Payer: Self-pay | Admitting: Neurology

## 2015-07-01 ENCOUNTER — Ambulatory Visit (INDEPENDENT_AMBULATORY_CARE_PROVIDER_SITE_OTHER): Payer: Medicare Other | Admitting: Neurology

## 2015-07-01 VITALS — BP 167/80 | HR 84 | Ht 62.0 in | Wt 210.0 lb

## 2015-07-01 DIAGNOSIS — G252 Other specified forms of tremor: Principal | ICD-10-CM

## 2015-07-01 DIAGNOSIS — G250 Essential tremor: Secondary | ICD-10-CM

## 2015-07-01 DIAGNOSIS — G43009 Migraine without aura, not intractable, without status migrainosus: Secondary | ICD-10-CM

## 2015-07-01 DIAGNOSIS — R251 Tremor, unspecified: Secondary | ICD-10-CM | POA: Diagnosis not present

## 2015-07-01 NOTE — Patient Instructions (Signed)

## 2015-07-01 NOTE — Progress Notes (Signed)
Reason for visit: Migraine headache  Brandy Oconnell is an 74 y.o. female  History of present illness:  Ms. Brandy Oconnell is a 74 year old right-handed white female with a history of migraine headaches. The patient was last seen through this office in 2014. Since that time, she has retired, and her headaches have markedly improved. The patient may go to 3 or 4 months without any headaches, and the headaches are quite responsive to Tylenol. The patient has had a several decade history of mild tremors of both upper extremities, right greater than left. The patient indicates that the tremors have gradually worsened over time. They affect her handwriting at times, she denies any significant issues with feeding herself. She does report that her maternal grandmother and mother and sister also have tremor. The mother developed a head and neck tremor later on in life, and the patient herself will note a head and neck tremor if she is sick or under stress. The patient has never taken any medications for the tremor, she is not sure that she wants to go on medication at this time. She returns for further evaluation.  Past Medical History  Diagnosis Date  . ALLERGIC RHINITIS   . Asthma   . Hypertension   . Bronchiectasis   . Hyperlipidemia   . Hypothyroidism   . Obesity   . Palpitation   . Depression   . Migraine   . Pelvic fracture   . Migraine without aura, without mention of intractable migraine without mention of status migrainosus 09/13/2013  . Essential and other specified forms of tremor 09/13/2013  . Fibromyalgia   . Degenerative arthritis   . Kidney stone     Past Surgical History  Procedure Laterality Date  . Vesicovaginal fistula closure w/ tah  1984  . Gyn laparoscopy    . Nasal septum surgery    . Laparoscopic hysterectomy      Family History  Problem Relation Age of Onset  . Heart disease Brother   . Heart disease Mother   . Asthma Mother   . Kidney cancer Mother   . Cancer  Mother   . Diabetes Mother   . Stroke Mother   . Tremor Mother   . Headache Mother   . Heart disease Sister   . Diabetes Sister   . Tremor Sister   . COPD Father   . Stroke Father   . Bladder Cancer Father   . Heart attack Maternal Aunt   . Heart disease Maternal Grandmother   . Diabetes Maternal Grandmother   . Tremor Maternal Grandmother   . Kidney cancer Paternal Grandfather   . Heart attack Brother   . Hypertension Sister     Social history:  reports that she has never smoked. She has never used smokeless tobacco. She reports that she does not drink alcohol or use illicit drugs.    Allergies  Allergen Reactions  . Erythromycin   . Amlodipine Swelling  . Beta Adrenergic Blockers   . Codeine   . Pneumovax 23 [Pneumococcal Vac Polyvalent]     Large local reaction-rash. (2008)  . Sulfonamide Derivatives   . Tramadol     REACTION: Nightmares    Medications:  Prior to Admission medications   Medication Sig Start Date End Date Taking? Authorizing Provider  albuterol (PROVENTIL HFA;VENTOLIN HFA) 108 (90 BASE) MCG/ACT inhaler Inhale 2 puffs into the lungs every 6 (six) hours as needed for wheezing or shortness of breath. 10/14/14  Yes Clinton D Young,  MD  aspirin 81 MG tablet Take 81 mg by mouth daily.   Yes Historical Provider, MD  calcium citrate (CALCITRATE - DOSED IN MG ELEMENTAL CALCIUM) 950 MG tablet Take 630 mg of elemental calcium by mouth daily.   Yes Historical Provider, MD  furosemide (LASIX) 40 MG tablet Take 40 mg by mouth daily. If fluid retention take 1/2 tablet extra   Yes Historical Provider, MD  levalbuterol Pauline Aus) 1.25 MG/3ML nebulizer solution 1 neb up to 4 times daily as needed 10/10/14  Yes Waymon Budge, MD  levothyroxine (SYNTHROID, LEVOTHROID) 112 MCG tablet Take 112 mcg by mouth daily.    Yes Historical Provider, MD  loratadine (CLARITIN) 10 MG tablet Take 10 mg by mouth daily.   Yes Historical Provider, MD  LORazepam (ATIVAN) 1 MG tablet Take 1  tablet (1 mg total) by mouth 2 (two) times daily as needed for anxiety. 10/10/14  Yes Waymon Budge, MD  montelukast (SINGULAIR) 10 MG tablet Take 1 tablet (10 mg total) by mouth daily. 10/15/14  Yes Waymon Budge, MD  rosuvastatin (CRESTOR) 10 MG tablet Take 1 tablet (10 mg total) by mouth every 7 (seven) days. 01/15/15  Yes Mihai Croitoru, MD  SUMAtriptan (IMITREX) 100 MG tablet Take 100 mg by mouth 2 (two) times daily as needed for migraine. May repeat in 2 hours if headache persists or recurs. 09/13/13  Yes York Spaniel, MD  valsartan (DIOVAN) 320 MG tablet Take 1 tablet (320 mg total) by mouth daily. 01/15/15  Yes Mihai Croitoru, MD  vitamin B-12 (CYANOCOBALAMIN) 500 MCG tablet Take 500 mcg by mouth daily.   Yes Historical Provider, MD  Vitamin D, Cholecalciferol, 1000 UNITS TABS Take 5,000 Units by mouth daily.    Yes Historical Provider, MD    ROS:  Out of a complete 14 system review of symptoms, the patient complains only of the following symptoms, and all other reviewed systems are negative.  Eye itching Tremors Migraine headache  Blood pressure 167/80, pulse 84, height  (1.575 m), weight 210 lb (95.255 kg).  Physical Exam  General: The patient is alert and cooperative at the time of the examination. The patient is moderately obese.  Skin: No significant peripheral edema is noted.   Neurologic Exam  Mental status: The patient is alert and oriented x 3 at the time of the examination. The patient has apparent normal recent and remote memory, with an apparently normal attention span and concentration ability.   Cranial nerves: Facial symmetry is present. Speech is normal, no aphasia or dysarthria is noted. Extraocular movements are full. Visual fields are full.  Motor: The patient has good strength in all 4 extremities.  Sensory examination: Soft touch sensation is symmetric on the face, arms, and legs.  Coordination: The patient has good finger-nose-finger and  heel-to-shin bilaterally. A mild intention tremor seen with finger-nose-finger bilaterally.  Gait and station: The patient has a normal gait. Tandem gait is slightly unsteady. Romberg is negative. No drift is seen.  Reflexes: Deep tendon reflexes are symmetric.   Assessment/Plan:  1. Migraine headache, improved  2. Benign essential tremor  The patient currently is doing quite well to headaches. She has a mild tremor, but she does not wish to have medical therapy for this. At this time, we will have the patient come back to the office on an as-needed basis. She will contact our office if any needs arise.  Marlan Palau MD 07/01/2015 7:41 PM  [facility] [phone]  [address] [place], [place] [address]  Phone [phone] Fax [phone]

## 2015-07-24 ENCOUNTER — Ambulatory Visit
Admission: RE | Admit: 2015-07-24 | Discharge: 2015-07-24 | Disposition: A | Discharge status: Home / Self Care | Payer: Medicare Other | Source: Ambulatory Visit | Attending: Endocrinology | Admitting: Endocrinology

## 2015-07-24 DIAGNOSIS — Z1231 Encounter for screening mammogram for malignant neoplasm of breast: Secondary | ICD-10-CM

## 2015-07-24 DIAGNOSIS — M8580 Other specified disorders of bone density and structure, unspecified site: Secondary | ICD-10-CM

## 2015-10-13 ENCOUNTER — Encounter: Payer: Self-pay | Admitting: Internal Medicine

## 2015-10-13 ENCOUNTER — Ambulatory Visit (INDEPENDENT_AMBULATORY_CARE_PROVIDER_SITE_OTHER): Payer: Medicare Other | Admitting: Internal Medicine

## 2015-10-13 VITALS — BP 126/68 | HR 86 | Ht 61.0 in | Wt 211.2 lb

## 2015-10-13 DIAGNOSIS — J309 Allergic rhinitis, unspecified: Secondary | ICD-10-CM

## 2015-10-13 DIAGNOSIS — J3089 Other allergic rhinitis: Principal | ICD-10-CM

## 2015-10-13 DIAGNOSIS — J449 Chronic obstructive pulmonary disease, unspecified: Secondary | ICD-10-CM

## 2015-10-13 DIAGNOSIS — J302 Other seasonal allergic rhinitis: Secondary | ICD-10-CM

## 2015-10-13 NOTE — Patient Instructions (Signed)
We are not giving pneumonia vaccine because of your history of large local reaction to the Pneumovax-23 vaccine  Please call as needed

## 2015-10-13 NOTE — Progress Notes (Signed)
Patient ID: Brandy Oconnell, female    DOB: 17-Dec-1940, 74 y.o.   MRN: [id number]  HPI 07/29/11-37 year old never smoker followed for asthma, Allergic rhinitis, bronchitis Last here January 28, 2011 -note reviewed Noticing some sneezing in last few days- seasonal. Asthmatic bronchitis has been significantly improved by resumption of Singulair. Rescue inhaler is not needed often, less than once per week. Continues aware of palpitation without change and without chest pain or syncope. 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 Ankle edema improved significantly as Norvasc dose reduced.  02/03/12- 32 year old never smoker followed for asthma, Allergic rhinitis, bronchitis Nasal stuffiness. Sudafed caused palpitations. Allegra caused nausea. She prefers Zyrtec.. Does saline nasal rinse. Not wheezing now. Does have a nebulizer with Xopenex for use if needed.  08/08/12- 58 year old never smoker followed for asthma, Allergic rhinitis, bronchitis Starting to have slight runny nose due to ragweed. Daily Singulair prevents coughing she denies wheeze. Occasionally needs her rescue Xopenex inhaler, less than once per week. 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  02/08/13- 35 year old never smoker followed for asthma, Allergic rhinitis, bronchitis FOLLOWS FOR: sneezing alot since last visit; slight wheezing yesterday, as long as on singulair no cough. Nasal stuffiness. Little wheeze. She reminds me of surgical history-turbinate reduction. Mobility has been limited by arthritis pain and knee. Has had peripheral edema with some stasis changes lower extremities. CXR 09/01/10 IMPRESSION:  1. Bilateral peribronchial thickening. This can be seen in the  setting of asthma, smoking, or viral infection, and may be chronic.  2. No airspace disease.  3. Borderline cardiomegaly.  Provider:  Darrelyn Hillock  08/08/13- 83 year old never smoker followed for asthma, Allergic rhinitis, bronchitis FOLLOWS FOR: humid days patient has to stay indoors as much as possible due to being unable ot breathe. Pt has started to sneeze. Has started seasonal ragweed sniffing-antihistamine. Needs daily Singulair to prevent coughing. Home being treated for bed bugs CXR 02/08/13- IMPRESSION:  Bronchitic changes with minimal right basilar atelectasis.  Original Report Authenticated By: Ulyses Southward, M.D.  10/09/13-  4 year old never smoker followed for asthma/ chronic bronchitis, Allergic rhinitis,  FOLLOWS FOR:c/o runny nose,sneezing x 1 wk. worse,no sob,denies cough or wheeze Depending on loratadine and singulair. C/o watery nose and wakes w/ bloody crusting. Sneezing, no HA. No wheeze. Uses nebulizer very occasionally.  Discussed sleep- frequent night mares- asks lorazepam refill.  04/09/14- 14 year old never smoker followed for asthma/ chronic bronchitis, Allergic rhinitis, complicated by IBS  FOLLOWS FOR:  Sinus pressure, PND, sneezing and itchy water eyes External otitis responded to Cipro ointment from Dr. Evlyn Kanner. Saw an oral surgeon for tooth abscess and took amoxicillin for 2 weeks. Allergic rhinitis symptoms-daily Claritin. Can't stay outside without rhinorrhea. Continues Singulair but says Allegra and Zyrtec causes nausea.Have overdrying her to the point of epistaxis. Occasional minor wheeze around cut flowers a grocery store, perfumes. Sleep-she was taking Ativan to suppress nightmares but doesn't seem to work. Taking Norpramin 50 mg for irritable bowel.  10/10/14- 66 year old never smoker followed for asthma/ chronic bronchitis, Allergic rhinitis, complicated by IBS, hx kidney stone FOLLOWS FOR: patient was just a little upset that her appointment had been calncelled apparently by another office. We were able to see her. Throat tickle, light cough. Albuterol HFA too stimulating. Can try  Atrovent HFA. Discussed her albuterol neb solution.  10/13/15- 4 year old never smoker followed for asthma/ chronic bronchitis, Allergic rhinitis, complicated by IBS, hx  kidney stone FOLLOWS FOR: Pt had one episode in June 2016-took prednisone and doing well since. Runny nose and sneezing as far as allergy concerns.  Occasional sneeze without significant exacerbation. She had had a lot of trouble within her apartment with heat and air conditioning systems over the last year but that seems to be resolved. History of large local reaction to Pneumovax-23 and we recommended she not have further pneumonia vaccine for now.  Review of Systems- see HPI Constitutional:   No-   weight loss, night sweats, fevers, chills,   fatigue, lassitude. HEENT:   No-  headaches, difficulty swallowing, tooth/dental problems, sore throat,       +-  sneezing, itching, ear ache,+nasal congestion, post nasal drip,  CV:  No-   chest pain, orthopnea, PND, + swelling in lower extremities, No- anasarca, dizziness,                + palpitations Resp: + shortness of breath with exertion or at rest.              No-   productive cough,  No non-productive cough,  No-  coughing up of blood.              No-   change in color of mucus.  + wheezing.   Skin: No-   rash or lesions. GI:  No-   heartburn, indigestion, abdominal pain, nausea, vomiting,  GU:  MS:  +  joint pain or swelling. . Neuro- nothing unusual  Psych:  No- change in mood or affect. No depression or anxiety.  No memory loss.    Objective:   Physical Exam General- Alert, Oriented, Affect-guarded, Distress- none acute;  overweight Skin- rash-none, lesions- none, excoriation- none Lymphadenopathy- none Head- atraumatic            Eyes- Gross vision intact, PERRLA, conjunctivae clear secretions            Ears- Hearing- normal            Nose- + mild turbinate edema, No-Septal dev, mucus, polyps, erosion, perforation             Throat- Mallampati III , mucosa  clear , drainage- none, tonsils- atrophic Neck- flexible , trachea midline, no stridor , thyroid nl, carotid no bruit Chest - symmetrical excursion , unlabored           Heart/CV- RRR , no murmur , no gallop , no rub, nl s1 s2                           - JVD- none , edema+ trace, stasis changes+, varices- none           Lung- clear, unlabored, wheeze- none, cough- none , dullness-none, rub- none           Chest wall-  Abd-  Br/ Gen/ Rectal- Not done, not indicated Extrem- cyanosis- none, clubbing, none, atrophy- none, strength- nl Neuro- grossly intact to observation

## 2015-10-14 NOTE — Assessment & Plan Note (Signed)
Well-controlled in recent months without exacerbation associated with fall season. No changes to suggest.

## 2015-10-14 NOTE — Assessment & Plan Note (Signed)
Managed now with antihistamine/nasal spray when needed

## 2015-10-30 ENCOUNTER — Other Ambulatory Visit: Payer: Self-pay | Admitting: Cardiovascular Disease

## 2015-10-30 NOTE — Telephone Encounter (Signed)
REFILL 

## 2016-01-19 ENCOUNTER — Other Ambulatory Visit: Payer: Self-pay | Admitting: Cardiovascular Disease

## 2016-01-21 ENCOUNTER — Ambulatory Visit: Payer: Medicare Other | Admitting: Cardiovascular Disease

## 2016-01-22 ENCOUNTER — Encounter: Payer: Self-pay | Admitting: Cardiovascular Disease

## 2016-01-22 ENCOUNTER — Ambulatory Visit (INDEPENDENT_AMBULATORY_CARE_PROVIDER_SITE_OTHER): Payer: Medicare Other | Admitting: Cardiovascular Disease

## 2016-01-22 VITALS — BP 156/84 | HR 73 | Ht 61.0 in | Wt 209.8 lb

## 2016-01-22 DIAGNOSIS — I10 Essential (primary) hypertension: Secondary | ICD-10-CM

## 2016-01-22 DIAGNOSIS — E785 Hyperlipidemia, unspecified: Secondary | ICD-10-CM

## 2016-01-22 DIAGNOSIS — Z6835 Body mass index (BMI) 35.0-35.9, adult: Secondary | ICD-10-CM

## 2016-01-22 MED ORDER — AZILSARTAN MEDOXOMIL 40 MG PO TABS: 1.0000 | ORAL_TABLET | Freq: Every day | ORAL | Status: DC

## 2016-01-22 NOTE — Patient Instructions (Signed)
Your physician has recommended you make the following change in your medication:   EDARBI 40 MG DAILY  MONITOR YOUR BLOOD PRESSURES AND CONTACT us WITH THE READINGS IN ONE MONTH AND LET us KNOW HOW YOU ARE FEELING ON THE MEDICATION.  Dr. Royann Shivers recommends that you schedule a follow-up appointment in: ONE YEAR

## 2016-01-22 NOTE — Progress Notes (Signed)
Patient ID: Brandy Oconnell, female   DOB: [birth year], 75 y.o.   MRN: [id number]    Cardiology Office Note    Date:  01/24/2016   ID:  Brandy Oconnell, Brandy Oconnell 08-Dec-1941, MRN [id number]  PCP:  Julian Hy, MD  Cardiologist:   Thurmon Fair, MD   Chief Complaint  Patient presents with  . Annual Exam    pt states no Sx.    History of Present Illness:  Brandy Oconnell is a 75 y.o. female with systemic hypertension and hyperlipidemia returns for follow-up. Her blood pressure has been mildly but consistently elevated in the 150s. He has had difficulty identifying an effective treatment for her blood pressure due to side effects with numerous medications. She developed massive uncomfortable leg edema with calcium channel blockers (both amlodipine and verapamil) and did not tolerate beta blockers. She has asthma. She developed a cough with ACE inhibitors. She has no side effects with valsartan, but this is not doing a great job of controlling her blood pressure is monotherapy. She takes loop diuretics on a daily basis. She has a history of calcium oxalate kidney stones.  She remains moderate to severely obese. She has actually gained weight since last year and is approaching morbid obesity.  She also has hyperlipidemia but has been intolerant to multiple statins including daily rosuvastatin, pravastatin and Vytorin. So did not tolerate Zetia. She seems to tolerate once weekly rosuvastatin without complaints, but her lipid profile is not quite at target  Past Medical History  Diagnosis Date  . ALLERGIC RHINITIS   . Asthma   . Hypertension   . Bronchiectasis   . Hyperlipidemia   . Hypothyroidism   . Obesity   . Palpitation   . Depression   . Migraine   . Pelvic fracture (HCC)   . Migraine without aura, without mention of intractable migraine without mention of status migrainosus 09/13/2013  . Essential and other specified forms of tremor 09/13/2013  . Fibromyalgia   . Degenerative  arthritis   . Kidney stone     Past Surgical History  Procedure Laterality Date  . Vesicovaginal fistula closure w/ tah  1984  . Gyn laparoscopy    . Nasal septum surgery    . Laparoscopic hysterectomy      Outpatient Prescriptions Prior to Visit  Medication Sig Dispense Refill  . aspirin 81 MG tablet Take 81 mg by mouth daily.    . calcium citrate (CALCITRATE - DOSED IN MG ELEMENTAL CALCIUM) 950 MG tablet Take 630 mg of elemental calcium by mouth daily.    . furosemide (LASIX) 40 MG tablet Take 40 mg by mouth daily. If fluid retention take 1/2 tablet extra    . levothyroxine (SYNTHROID, LEVOTHROID) 112 MCG tablet Take 112 mcg by mouth daily.     Marland Kitchen loratadine (CLARITIN) 10 MG tablet Take 10 mg by mouth daily.    Marland Kitchen LORazepam (ATIVAN) 1 MG tablet Take 1 tablet (1 mg total) by mouth 2 (two) times daily as needed for anxiety. 30 tablet 5  . rosuvastatin (CRESTOR) 10 MG tablet Take 1 tablet (10 mg total) by mouth every 7 (seven) days. 25 tablet 3  . SUMAtriptan (IMITREX) 100 MG tablet Take 100 mg by mouth 2 (two) times daily as needed for migraine. May repeat in 2 hours if headache persists or recurs.    . vitamin B-12 (CYANOCOBALAMIN) 500 MCG tablet Take 500 mcg by mouth daily.    . Vitamin D, Cholecalciferol, 1000 UNITS TABS Take 5,000  Units by mouth daily.     . valsartan (DIOVAN) 320 MG tablet Take 1 tablet by mouth  daily 90 tablet 0  . albuterol (PROVENTIL HFA;VENTOLIN HFA) 108 (90 BASE) MCG/ACT inhaler Inhale 2 puffs into the lungs every 6 (six) hours as needed for wheezing or shortness of breath. 1 Inhaler 6  . levalbuterol (XOPENEX) 1.25 MG/3ML nebulizer solution 1 neb up to 4 times daily as needed 225 mL 3  . montelukast (SINGULAIR) 10 MG tablet Take 1 tablet (10 mg total) by mouth daily. (Patient not taking: Reported on 10/13/2015) 90 tablet 3   No facility-administered medications prior to visit.     Allergies:   Erythromycin; Amlodipine; Beta adrenergic blockers; Codeine;  Pneumovax 23; Sulfonamide derivatives; and Tramadol   Social History   Social History  . Marital Status: Divorced    Spouse Name: N/A  . Number of Children: 0  . Years of Education: COLLEGE   Occupational History  . full time clinical lab tech    Social History Main Topics  . Smoking status: Never Smoker   . Smokeless tobacco: Never Used  . Alcohol Use: No  . Drug Use: No  . Sexual Activity: Not Asked   Other Topics Concern  . None   Social History Narrative   Patient does not drink caffeine.   Patient is right handed.     Family History:  The patient's family history includes Asthma in her mother; Bladder Cancer in her father; COPD in her father; Cancer in her mother; Diabetes in her maternal grandmother, mother, and sister; Headache in her mother; Heart attack in her brother and maternal aunt; Heart disease in her brother, maternal grandmother, mother, and sister; Hypertension in her sister; Kidney cancer in her mother and paternal grandfather; Stroke in her father and mother; Tremor in her maternal grandmother, mother, and sister.   ROS:   Please see the history of present illness.    ROS All other systems reviewed and are negative.   PHYSICAL EXAM:   VS:  BP 156/84 mmHg  Pulse 73  Ht  (1.549 m)  Wt 95.165 kg (209 lb 12.8 oz)  BMI 39.66 kg/m2   GEN: Well nourished, well developed, in no acute distress HEENT: normal Neck: no JVD, carotid bruits, or masses Cardiac: RRR; no murmurs, rubs, or gallops,no edema  Respiratory:  clear to auscultation bilaterally, normal work of breathing GI: soft, nontender, nondistended, + BS MS: no deformity or atrophy Skin: warm and dry, no rash Neuro:  Alert and Oriented x 3, Strength and sensation are intact Psych: euthymic mood, full affect  Wt Readings from Last 3 Encounters:  01/22/16 95.165 kg (209 lb 12.8 oz)  10/13/15 95.8 kg (211 lb 3.2 oz)  07/01/15 95.255 kg (210 lb)      Studies/Labs Reviewed:   EKG:  EKG is  ordered today.  The ekg ordered today demonstrates NSR  Recent Labs: BUN 9, creatinine 0.8, glucose 86, normal LFTs Most recent TSH 0.22 (low) with normal free T4 1 0.5  Lipid Panel 04/28/2015 total cholesterol 182, triglycerides 179, HDL 42, LDL 104 11/03/2015 total cholesterol 195 triglycerides 178, HDL 43, LDL 116 [facility]    ASSESSMENT:    1. Essential hypertension   2. Hyperlipidemia      PLAN:  In order of problems listed above:  1. HTN: We'll try to switch from valsartan to [place]. This is a more potent agent and hopefully will bring her blood pressure range, but unfortunately it  is brand name and expensive. Gave her samples enough to last for a month. If the medication works will try to find some financial support to get her this medication. 2. HLP: She wants to try taking twice weekly Crestor and repeat labs after that. Weight loss would be helpful    Medication Adjustments/Labs and Tests Ordered: Current medicines are reviewed at length with the patient today.  Concerns regarding medicines are outlined above.  Medication changes, Labs and Tests ordered today are listed in the Patient Instructions below. Patient Instructions  Your physician has recommended you make the following change in your medication:   EDARBI 40 MG DAILY  MONITOR YOUR BLOOD PRESSURES AND CONTACT us WITH THE READINGS IN ONE MONTH AND LET us KNOW HOW YOU ARE FEELING ON THE MEDICATION.  Dr. Royann Shivers recommends that you schedule a follow-up appointment in: ONE YEAR         SignedThurmon Fair, MD  01/24/2016 12:02 AM    [facility] [address] [place], [place], [place]  [address] Phone: [phone]; Fax: [phone]

## 2016-01-23 ENCOUNTER — Other Ambulatory Visit: Payer: Self-pay | Admitting: Cardiovascular Disease

## 2016-01-23 MED ORDER — AZILSARTAN MEDOXOMIL 40 MG PO TABS: 1.0000 | ORAL_TABLET | Freq: Every day | ORAL | Status: DC

## 2016-01-23 NOTE — Telephone Encounter (Signed)
Verified with B. Lassiter, CMA that valsartan was d/c'ed on 01/22/16 and Raynelle Chary was started.  Rx(s) sent to pharmacy electronically.

## 2016-01-27 ENCOUNTER — Telehealth: Payer: Self-pay | Admitting: Pharmacist Clinician (PhC)/ Clinical Pharmacy Specialist

## 2016-01-27 MED ORDER — ROSUVASTATIN CALCIUM 10 MG PO TABS: ORAL_TABLET | ORAL | Status: DC

## 2016-01-27 NOTE — Telephone Encounter (Signed)
Patient called, upset about prescription for Floria Raveling being sent to mail order pharmacy.  States she was to try samples for 4 weeks, then see me to determine effectiveness.  If works, we could send her to [facility] for better [employer].  Patient states [facility] billed her credit card over $400.  Fortunately she caught the charge and was able to stop shipment and has been told they will credit her that amount.    She also wanted to know why prescription for increased amount of Crestor was not called in.  States she was told to double dose to 10 mg twice weekly, but prescription is still $45 for 13 tablets.  She is not happy with this price either.  I called [facility].  Their cash price for generic rosuvastatin is $10.85 for 30 tablets.  Spoke with patient again, she would like rosuvastatin prescription sent to [facility].

## 2016-05-13 ENCOUNTER — Telehealth: Payer: Self-pay | Admitting: Pharmacist Clinician (PhC)/ Clinical Pharmacy Specialist

## 2016-05-13 NOTE — Telephone Encounter (Signed)
Pt called, LMOM stating doesn't like copay for Edarby, would like to go back on valsartan.  Dr C last office notes stated that she was not maintaining good BP control with valsartan.  LMOM for patient to call back

## 2016-05-14 MED ORDER — VALSARTAN 320 MG PO TABS: 320.0000 mg | ORAL_TABLET | Freq: Every day | ORAL | Status: DC

## 2016-05-14 NOTE — Telephone Encounter (Signed)
Patient returning call about medication switch back to valsartan. Will change her back to valsartan as she cannot afford the [place]. RX sent for 90 day supply to pharmacy of choice. Will have her scheduled to have blood pressure check in 2-3 weeks.

## 2016-06-25 ENCOUNTER — Other Ambulatory Visit: Payer: Self-pay | Admitting: Endocrinology

## 2016-06-25 DIAGNOSIS — Z1231 Encounter for screening mammogram for malignant neoplasm of breast: Secondary | ICD-10-CM

## 2016-07-01 ENCOUNTER — Other Ambulatory Visit: Payer: Self-pay | Admitting: Cardiovascular Disease

## 2016-08-02 ENCOUNTER — Ambulatory Visit: Payer: Medicare Other

## 2016-09-29 ENCOUNTER — Ambulatory Visit
Admission: RE | Admit: 2016-09-29 | Discharge: 2016-09-29 | Disposition: A | Discharge status: Home / Self Care | Payer: Medicare Other | Source: Ambulatory Visit | Attending: Endocrinology | Admitting: Endocrinology

## 2016-09-29 DIAGNOSIS — Z1231 Encounter for screening mammogram for malignant neoplasm of breast: Secondary | ICD-10-CM

## 2016-10-18 ENCOUNTER — Encounter: Payer: Self-pay | Admitting: Internal Medicine

## 2016-10-18 ENCOUNTER — Ambulatory Visit (INDEPENDENT_AMBULATORY_CARE_PROVIDER_SITE_OTHER): Payer: Medicare Other | Admitting: Internal Medicine

## 2016-10-18 DIAGNOSIS — J4520 Mild intermittent asthma, uncomplicated: Secondary | ICD-10-CM

## 2016-10-18 DIAGNOSIS — G4709 Other insomnia: Secondary | ICD-10-CM | POA: Diagnosis not present

## 2016-10-18 DIAGNOSIS — G4700 Insomnia, unspecified: Secondary | ICD-10-CM | POA: Insufficient documentation

## 2016-10-18 MED ORDER — AZELASTINE HCL 0.1 % NA SOLN: NASAL | 12 refills | Status: DC

## 2016-10-18 MED ORDER — ESZOPICLONE 2 MG PO TABS: 2.0000 mg | ORAL_TABLET | Freq: Every evening | ORAL | 1 refills | Status: DC | PRN

## 2016-10-18 NOTE — Assessment & Plan Note (Signed)
Sensitive to stimulation from albuterol. Plan-try Incruse

## 2016-10-18 NOTE — Progress Notes (Signed)
Patient ID: Brandy Oconnell, female    DOB: 04/21/1941, 75 y.o.   MRN: [id number]  HPI F never smoker followed for asthma, Allergic rhinitis, bronchitis, complicated by IBS, kidney stone  .  10/10/14- 75 year old never smoker followed for asthma/ chronic bronchitis, Allergic rhinitis, complicated by IBS, hx kidney stone FOLLOWS FOR: patient was just a little upset that her appointment had been calncelled apparently by another office. We were able to see her. Throat tickle, light cough. Albuterol HFA too stimulating. Can try Atrovent HFA. Discussed her albuterol neb solution.  10/13/15- 75 year old never smoker followed for asthma/ chronic bronchitis, Allergic rhinitis, complicated by IBS, hx kidney stone FOLLOWS FOR: Pt had one episode in June 2016-took prednisone and doing well since. Runny nose and sneezing as far as allergy concerns.  Occasional sneeze without significant exacerbation. She had had a lot of trouble within her apartment with heat and air conditioning systems over the last year but that seems to be resolved. History of large local reaction to Pneumovax-23 and we recommended she not have further pneumonia vaccine for now  10/18/2016-75 year old female never smoker followed for asthma/chronic bronchitis, allergic rhinitis, complicated by IBS, kidney stone, degenerative arthritis, HBP Pt denies any changes since last OV. Pt denies any complaints but requests to speak with CY directly regarding her care.  Increased rhinorrhea over the past month with weather change. Occasional chest tightness. Intolerant of albuterol because of palpitations and tremor but insurance won't cover Xopenex HFA. Complains of insomnia-busy brain pattern, toss and turn. Ambien caused idiosyncratic arousal and agitation. FENO 10/18/16- 22   WNL  Review of Systems- see HPI Constitutional:   No-   weight loss, night sweats, fevers, chills,   fatigue, lassitude. HEENT:   No-  headaches, difficulty  swallowing, tooth/dental problems, sore throat,        sneezing, itching, ear ache,+nasal congestion, post nasal drip,  CV:  No-   chest pain, orthopnea, PND, + swelling in lower extremities, No- anasarca, dizziness,                + palpitations Resp: + shortness of breath with exertion or at rest.              No-   productive cough,  No non-productive cough,  No-  coughing up of blood.              No-   change in color of mucus.   wheezing.   Skin: No-   rash or lesions. GI:  No-   heartburn, indigestion, abdominal pain, nausea, vomiting,  GU:  MS:  +  joint pain or swelling. . Neuro- nothing unusual  Psych:  No- change in mood or affect. No depression or anxiety.  No memory loss.    Objective:   Physical Exam General- Alert, Oriented, Affect-guarded, Distress- none acute;  overweight Skin- rash-none, lesions- none, excoriation- none Lymphadenopathy- none Head- atraumatic            Eyes- Gross vision intact, PERRLA, conjunctivae clear secretions            Ears- Hearing- normal            Nose- + mild turbinate edema, No-Septal dev, mucus, polyps, erosion, perforation             Throat- Mallampati III , mucosa clear , drainage- none, tonsils- atrophic Neck- flexible , trachea midline, no stridor , thyroid nl, carotid no bruit Chest - symmetrical excursion , unlabored  Heart/CV- RRR , no murmur , no gallop , no rub, nl s1 s2                           - JVD- none , edema-none, stasis changes+, varices- none           Lung- clear, unlabored, wheeze- none, cough- none , dullness-none, rub- none           Chest wall-  Abd-  Br/ Gen/ Rectal- Not done, not indicated Extrem- cyanosis- none, clubbing, none, atrophy- none, strength- nl Neuro- grossly intact to observation

## 2016-10-18 NOTE — Assessment & Plan Note (Signed)
"  Busy brain" pattern. Discussed sleep hygiene. Brandy FantiPlan-try Lunesta with discussion

## 2016-10-18 NOTE — Patient Instructions (Signed)
Order- FENO  Dx asthma mild intermittent  If ok with insurance  Sample Incruse inhaler     Try inhaling 1 puff, once daily   See if it helps  Script printed for Lunesta 2 mg, try one at bedtime when needed for sleep  Script printed for azelastine nasal spray-   Try 1-2 puffs each nostril once or twice daily when needed for allergy drainage

## 2016-10-20 ENCOUNTER — Telehealth: Payer: Self-pay | Admitting: Internal Medicine

## 2016-10-20 ENCOUNTER — Other Ambulatory Visit: Payer: Self-pay | Admitting: Internal Medicine

## 2016-10-20 MED ORDER — IPRATROPIUM BROMIDE 0.06 % NA SOLN: NASAL | 12 refills | Status: DC

## 2016-10-20 MED ORDER — SUVOREXANT 15 MG PO TABS: 1.0000 | ORAL_TABLET | Freq: Every day | ORAL | 0 refills | Status: DC

## 2016-10-20 NOTE — Telephone Encounter (Signed)
Samples left up front for pt, pt aware.  Nothing further needed.

## 2016-10-20 NOTE — Addendum Note (Signed)
Addended by: Velvet BatheAULFIELD, Cassundra Mckeever L on: 10/20/2016 05:02 PM   Modules accepted: Orders

## 2016-10-20 NOTE — Telephone Encounter (Signed)
Lunesta not covered by insurance Needs to try ipratropium nasal sprayfor post nasal drip I have sent script to [facility] for ipratropium 0.06% nasal spray  Order- please have pt come by to pick up samples Belsomra tabs- 15 mg, # 9 tabs,  1 each night at bedtime for sleep

## 2016-10-27 ENCOUNTER — Telehealth: Payer: Self-pay | Admitting: Internal Medicine

## 2016-10-27 NOTE — Telephone Encounter (Signed)
Asks samples of Incruse- given # 2. Will discuss change to better covered [facility]

## 2016-11-22 ENCOUNTER — Telehealth: Payer: Self-pay | Admitting: Internal Medicine

## 2016-11-22 MED ORDER — BENZONATATE 100 MG PO CAPS: 100.0000 mg | ORAL_CAPSULE | Freq: Four times a day (QID) | ORAL | 1 refills | Status: DC | PRN

## 2016-11-22 NOTE — Telephone Encounter (Signed)
Has had a cold. Exhausting cough. Doesn't tolerate codeine Plan- tessalon perles sent to [facility]

## 2017-01-24 ENCOUNTER — Ambulatory Visit (INDEPENDENT_AMBULATORY_CARE_PROVIDER_SITE_OTHER): Payer: Medicare Other | Admitting: Cardiovascular Disease

## 2017-01-24 ENCOUNTER — Telehealth: Payer: Self-pay | Admitting: Cardiovascular Disease

## 2017-01-24 ENCOUNTER — Encounter: Payer: Self-pay | Admitting: Cardiovascular Disease

## 2017-01-24 VITALS — BP 130/76 | HR 79 | Ht 61.0 in | Wt 216.0 lb

## 2017-01-24 DIAGNOSIS — R011 Cardiac murmur, unspecified: Secondary | ICD-10-CM

## 2017-01-24 DIAGNOSIS — E782 Mixed hyperlipidemia: Secondary | ICD-10-CM

## 2017-01-24 DIAGNOSIS — I10 Essential (primary) hypertension: Secondary | ICD-10-CM | POA: Diagnosis not present

## 2017-01-24 NOTE — Progress Notes (Signed)
Patient ID: Brandy Oconnell, female   DOB: [birth year], 76 y.o.   MRN: [id number]    Cardiology Office Note    Date:  01/25/2017   ID:  Austin, Pongratz 08/28/1941, MRN [id number]  PCP:  Julian Hy, MD  Cardiologist:   Thurmon Fair, MD   Chief Complaint  Patient presents with  . Follow-up    History of Present Illness:  Brandy Oconnell is a 76 y.o. female with systemic hypertension and hyperlipidemia returns for follow-up.   Blood pressure control is good. She denies any issues of lower extremity edema, neurological problems, claudication, chest pain or dyspnea at rest or with activity, palpitations, dizziness or syncope. She has gained weight and is now in the morbid obese range. She has a weakness for sweets, especially cookies.  She is intolerant to daily statin administration and did not tolerate Zetia. She takes rosuvastatin twice weekly. Last labs showed total cholesterol 192, triglycerides 202, HDL 40, LDL 112 glucose 111, hemoglobin A1c 5.6%. Thyroid, liver and renal tests were normal (creat 0.8).  Since childhood, she has been told off and on that she sometimes has a murmur. A very faint systolic murmur in the aortic focus is her today.  Her blood pressure has been mildly but consistently elevated in the 150s. He has had difficulty identifying an effective treatment for her blood pressure due to side effects with numerous medications. She developed massive uncomfortable leg edema with calcium channel blockers (both amlodipine and verapamil) and did not tolerate beta blockers. She has asthma. She developed a cough with ACE inhibitors. She has no side effects with valsartan, but this is not doing a great job of controlling her blood pressure is monotherapy. She takes loop diuretics on a daily basis. She has a history of calcium oxalate kidney stones. She also has hyperlipidemia but has been intolerant to multiple statins including daily rosuvastatin, pravastatin and Vytorin.  Also did not tolerate Zetia. She seems to tolerate once weekly rosuvastatin without complaints, but her lipid profile is not quite at target  Past Medical History:  Diagnosis Date  . ALLERGIC RHINITIS   . Asthma   . Bronchiectasis   . Degenerative arthritis   . Depression   . Essential and other specified forms of tremor 09/13/2013  . Fibromyalgia   . Hyperlipidemia   . Hypertension   . Hypothyroidism   . Kidney stone   . Migraine   . Migraine without aura, without mention of intractable migraine without mention of status migrainosus 09/13/2013  . Obesity   . Palpitation   . Pelvic fracture [facility])     Past Surgical History:  Procedure Laterality Date  . gyn laparoscopy    . LAPAROSCOPIC HYSTERECTOMY    . NASAL SEPTUM SURGERY    . VESICOVAGINAL FISTULA CLOSURE W/ TAH  1984    Outpatient Medications Prior to Visit  Medication Sig Dispense Refill  . aspirin 81 MG tablet Take 81 mg by mouth daily.    . benzonatate (TESSALON) 100 MG capsule Take 1 capsule (100 mg total) by mouth every 6 (six) hours as needed for cough. 30 capsule 1  . calcium citrate (CALCITRATE - DOSED IN MG ELEMENTAL CALCIUM) 950 MG tablet Take 630 mg of elemental calcium by mouth daily.    . furosemide (LASIX) 40 MG tablet Take 40 mg by mouth daily. If fluid retention take 1/2 tablet extra    . ipratropium (ATROVENT) 0.06 % nasal spray 1-2 puffs each nostril up to 4 times  daily as needed 15 mL 12  . levothyroxine (SYNTHROID, LEVOTHROID) 112 MCG tablet Take 112 mcg by mouth daily.     Marland Kitchen. loratadine (CLARITIN) 10 MG tablet Take 10 mg by mouth daily.    Marland Kitchen. LORazepam (ATIVAN) 1 MG tablet Take 1 tablet (1 mg total) by mouth 2 (two) times daily as needed for anxiety. 30 tablet 5  . rosuvastatin (CRESTOR) 10 MG tablet TAKE 1 TABLET BY MOUTH  TWICE WEEKLY AS DIRECTED 26 tablet 3  . valsartan (DIOVAN) 320 MG tablet Take 1 tablet (320 mg total) by mouth daily. 90 tablet 3  . vitamin B-12 (CYANOCOBALAMIN) 500 MCG tablet Take  500 mcg by mouth daily.    . Vitamin D, Cholecalciferol, 1000 UNITS TABS Take 5,000 Units by mouth daily.     Marland Kitchen. azelastine (ASTELIN) 0.1 % nasal spray 1-2 puffs each nostril once or twice daily (Patient not taking: Reported on 01/24/2017) 30 mL 12  . eszopiclone (LUNESTA) 2 MG TABS tablet Take 1 tablet (2 mg total) by mouth at bedtime as needed for sleep. Take immediately before bedtime (Patient not taking: Reported on 01/24/2017) 30 tablet 1  . SUMAtriptan (IMITREX) 100 MG tablet Take 100 mg by mouth 2 (two) times daily as needed for migraine. May repeat in 2 hours if headache persists or recurs.    . Suvorexant (BELSOMRA) 15 MG TABS Take 1 tablet by mouth at bedtime. 9 tablet 0   No facility-administered medications prior to visit.      Allergies:   Erythromycin; Ace inhibitors; Beta adrenergic blockers; Codeine; Sulfonamide derivatives; Amlodipine; Pneumovax 23 [pneumococcal vac polyvalent]; and Tramadol   Social History   Social History  . Marital status: Divorced    Spouse name: N/A  . Number of children: 0  . Years of education: COLLEGE   Occupational History  . full time clinical lab tech    Social History Main Topics  . Smoking status: Never Smoker  . Smokeless tobacco: Never Used  . Alcohol use No  . Drug use: No  . Sexual activity: Not Asked   Other Topics Concern  . None   Social History Narrative   Patient does not drink caffeine.   Patient is right handed.     Family History:  The patient's family history includes Asthma in her mother; Bladder Cancer in her father; COPD in her father; Cancer in her mother; Diabetes in her maternal grandmother, mother, and sister; Headache in her mother; Heart attack in her brother and maternal aunt; Heart disease in her brother, maternal grandmother, mother, and sister; Hypertension in her sister; Kidney cancer in her mother and paternal grandfather; Stroke in her father and mother; Tremor in her maternal grandmother, mother, and  sister.   ROS:   Please see the history of present illness.    ROS All other systems reviewed and are negative.   PHYSICAL EXAM:   VS:  BP 130/76 (BP Location: Right Arm, Patient Position: Sitting, Cuff Size: Normal)   Pulse 79   Ht 5\' 1"  (1.549 m)   Wt 98 kg (216 lb)   BMI 40.81 kg/m    GEN: Well nourished, well developed, in no acute distress  HEENT: normal  Neck: no JVD, carotid bruits, or masses Cardiac: RRR; early peaking 1/6 systolic aortic ejection murmur, no diastolic murmurs, rubs, or gallops,no edema  Respiratory:  clear to auscultation bilaterally, normal work of breathing GI: soft, nontender, nondistended, + BS MS: no deformity or atrophy  Skin: warm and dry,  no rash Neuro:  Alert and Oriented x 3, Strength and sensation are intact Psych: euthymic mood, full affect  Wt Readings from Last 3 Encounters:  01/24/17 98 kg (216 lb)  10/18/16 97.5 kg (215 lb)  01/22/16 95.2 kg (209 lb 12.8 oz)      Studies/Labs Reviewed:   EKG:  EKG is ordered today.  The ekg ordered today demonstrates NSRLow voltage QRS and poor progression all suggestive of obesity, QTC 440 ms. There are no ischemic appearing repolarization abnormalities  Recent Labs: total cholesterol 192, triglycerides 202, HDL 40, LDL 112 glucose 111, hemoglobin A1c 5.6%. Thyroid, liver and renal tests were normal (creat 0.8)    ASSESSMENT:    1. Essential hypertension   2. Mixed hyperlipidemia   3. Morbid obesity (HCC)   4. Murmur, cardiac      PLAN:  In order of problems listed above:  1. HTN: Blood pressure control is good on the current regimen. 2. HLP: continue twice weekly Crestor and repeat labs after that. Weight loss would be very helpful. 3. Morbid obesity: Discussed diet and exercise in detail again. 4. 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 Echo offered but the patient prefers not to undergo testing. His quite reasonable as long as she remains  asymptomatic.    Medication Adjustments/Labs and Tests Ordered: Current medicines are reviewed at length with the patient today.  Concerns regarding medicines are outlined above.  Medication changes, Labs and Tests ordered today are listed in the Patient Instructions below. Patient Instructions  Dr Royann Shivers recommends that you schedule a follow-up appointment in 12 months. You will receive a reminder letter in the mail two months in advance. If you don't receive a letter, please call our office to schedule the follow-up appointment.  If you need a refill on your cardiac medications before your next appointment, please call your pharmacy.      Signed, Thurmon Fair, MD  01/25/2017 2:06 PM    [facility] [address] [place], [place], [place]  [address] Phone: [phone]; Fax: [phone]

## 2017-01-24 NOTE — Patient Instructions (Signed)
Dr Croitoru recommends that you schedule a follow-up appointment in 12 months. You will receive a reminder letter in the mail two months in advance. If you don't receive a letter, please call our office to schedule the follow-up appointment.  If you need a refill on your cardiac medications before your next appointment, please call your pharmacy. 

## 2017-01-24 NOTE — Telephone Encounter (Signed)
I have changed the interpretation of ECG in the final document. The diagnosis of myocardial infarction will not be listed in her records [facility]

## 2017-01-24 NOTE — Telephone Encounter (Signed)
New Message     Please call she has question about the test she had done, why did it say on the EKG that she had myocardial infraction

## 2017-01-24 NOTE — Telephone Encounter (Signed)
Pt of Dr. Royann Shiversroitoru Seen today in office  Pt aware EKG showed prior MI on it. She voiced that Dr. Royann Shiversroitoru explained that he didn't think this was the case. She explains she is nervous, not because she thinks she had an MI, but because she didn't want it being reported on her chart that she had an MI and she didn't. I explained to the patient that the EKG is always reviewed by the physician, and what is recorded on the EKG is not final or always indicative of interpreted results. She voiced understanding and thanks.

## 2017-02-23 ENCOUNTER — Other Ambulatory Visit: Payer: Self-pay | Admitting: Cardiovascular Disease

## 2017-02-23 NOTE — Telephone Encounter (Signed)
Rx(s) sent to pharmacy electronically.  

## 2017-04-28 ENCOUNTER — Other Ambulatory Visit: Payer: Self-pay | Admitting: Endocrinology

## 2017-04-28 DIAGNOSIS — Z1231 Encounter for screening mammogram for malignant neoplasm of breast: Secondary | ICD-10-CM

## 2017-06-30 ENCOUNTER — Telehealth: Payer: Self-pay | Admitting: *Deleted

## 2017-06-30 MED ORDER — IRBESARTAN 300 MG PO TABS: 300.0000 mg | ORAL_TABLET | Freq: Every day | ORAL | 3 refills | Status: DC

## 2017-06-30 NOTE — Telephone Encounter (Signed)
Patient calling about Valsartan Recall and wanted to know what should she take.  She states she is going to continue taking Valsartan until she is called with an alternative.  Please advice.

## 2017-06-30 NOTE — Telephone Encounter (Signed)
Pt states mail order told her that her valsartan was recalled and to try and get different ARB, as if they do get un-recalled product there will probably be a back-log of people needing it.    Will switch her to irbesartan 300 mg (no CHF noted) daily and patient aware to check home BP for 2-3 weeks after switch

## 2017-07-01 NOTE — Telephone Encounter (Signed)
Called [facility] to clarify allergies. Christella HartiganJacobs Pharmacist released the prescription.  Called patient to update.

## 2017-07-01 NOTE — Addendum Note (Signed)
Addended by: Pearletha FurlODRIGUEZ-GUZMAN, Zara Wendt on: 07/01/2017 04:01 PM   Modules accepted: Orders

## 2017-07-01 NOTE — Telephone Encounter (Signed)
Pt calling back concerning the medication Irbesartan. Pt stated that [facility] mail order called her and informed her that they would not be sending her medication out because pt is allergic to beta blockers and ace inhibitors and until the pharmacy hear from the doctor's office, they will not be sending the medication out. Pt stated that she is almost our of medication. Please advise

## 2017-07-04 ENCOUNTER — Other Ambulatory Visit: Payer: Self-pay | Admitting: *Deleted

## 2017-07-05 ENCOUNTER — Telehealth: Payer: Self-pay | Admitting: *Deleted

## 2017-07-05 NOTE — Telephone Encounter (Signed)
Patient calling to have pharmacist call her back about questions on her replacement for Valsartan.

## 2017-07-06 NOTE — Telephone Encounter (Signed)
Patient was concerned about call from mail order pharmacy regarding drug allergy to ACEI and being prescribed ARB.  Assured her that we clarified information with pharmacy and explained the concern that they raised.  Patient thanked us for explaining to her.

## 2017-07-11 ENCOUNTER — Telehealth (INDEPENDENT_AMBULATORY_CARE_PROVIDER_SITE_OTHER): Payer: Self-pay | Admitting: Rheumatology

## 2017-07-11 NOTE — Telephone Encounter (Signed)
Patient called inquiring about her records from [facility] I advised patient to call [facility].

## 2017-07-22 ENCOUNTER — Other Ambulatory Visit: Payer: Self-pay | Admitting: Cardiovascular Disease

## 2017-07-28 ENCOUNTER — Telehealth: Payer: Self-pay | Admitting: Internal Medicine

## 2017-07-28 MED ORDER — AMOXICILLIN 500 MG PO CAPS: 500.0000 mg | ORAL_CAPSULE | Freq: Two times a day (BID) | ORAL | 0 refills | Status: DC

## 2017-07-28 MED ORDER — DOXYCYCLINE HYCLATE 100 MG PO TABS: ORAL_TABLET | ORAL | 0 refills | Status: DC

## 2017-07-28 NOTE — Telephone Encounter (Addendum)
CY came to triage with note: cancel order sent for doxycline and add to her intolerance list for nausea/vomiting.  Order Amoxicillin 500mg  #14, 1po BID.  [facility] R[facility] HardingEllis.  Discussed the above and gave verbal for the Amoxicillin 500mg  #14, 1 po BID with no refills.  Rennis Hardingllis will also mark patient's chart on the pharmacy's end for the Doxycycline.  Med and allergy/intolerance list updated Pt is aware of the above Nothing further needed; will sign off

## 2017-07-28 NOTE — Addendum Note (Signed)
Addended by: Boone MasterJONES, Kelsen Celona E on: 07/28/2017 01:02 PM   Modules accepted: Orders

## 2017-07-28 NOTE — Telephone Encounter (Signed)
Sore in nose, pink external, not responding to Polysporin topical.  Script sent for doxycycline

## 2017-08-23 ENCOUNTER — Telehealth: Payer: Self-pay | Admitting: Internal Medicine

## 2017-08-23 MED ORDER — AMOXICILLIN 500 MG PO CAPS: 500.0000 mg | ORAL_CAPSULE | Freq: Two times a day (BID) | ORAL | 0 refills | Status: DC

## 2017-08-23 NOTE — Telephone Encounter (Signed)
Asks [facility] for sinusitis and bronchitis[facility]

## 2017-09-13 ENCOUNTER — Emergency Department (HOSPITAL_COMMUNITY)
Admission: EM | Admit: 2017-09-13 | Discharge: 2017-09-14 | Disposition: A | Discharge status: Home / Self Care | Payer: Medicare Other | Attending: Emergency Medicine | Admitting: Emergency Medicine

## 2017-09-13 ENCOUNTER — Encounter (HOSPITAL_COMMUNITY): Payer: Self-pay | Admitting: Emergency Medicine

## 2017-09-13 ENCOUNTER — Emergency Department (HOSPITAL_COMMUNITY): Payer: Medicare Other

## 2017-09-13 DIAGNOSIS — N130 Hydronephrosis with ureteropelvic junction obstruction: Secondary | ICD-10-CM | POA: Insufficient documentation

## 2017-09-13 DIAGNOSIS — R110 Nausea: Secondary | ICD-10-CM | POA: Diagnosis not present

## 2017-09-13 DIAGNOSIS — Z7982 Long term (current) use of aspirin: Secondary | ICD-10-CM | POA: Insufficient documentation

## 2017-09-13 DIAGNOSIS — Z79899 Other long term (current) drug therapy: Secondary | ICD-10-CM | POA: Diagnosis not present

## 2017-09-13 DIAGNOSIS — I10 Essential (primary) hypertension: Secondary | ICD-10-CM | POA: Insufficient documentation

## 2017-09-13 DIAGNOSIS — N201 Calculus of ureter: Secondary | ICD-10-CM | POA: Diagnosis not present

## 2017-09-13 DIAGNOSIS — R1032 Left lower quadrant pain: Secondary | ICD-10-CM | POA: Diagnosis present

## 2017-09-13 LAB — CBC
HCT: 40.3 % (ref 36.0–46.0)
Hemoglobin: 13.7 g/dL (ref 12.0–15.0)
MCH: 29.5 pg (ref 26.0–34.0)
MCHC: 34 g/dL (ref 30.0–36.0)
MCV: 86.9 fL (ref 78.0–100.0)
PLATELETS: 266 10*3/uL (ref 150–400)
RBC: 4.64 MIL/uL (ref 3.87–5.11)
RDW: 13.2 % (ref 11.5–15.5)
WBC: 11.6 10*3/uL — AB (ref 4.0–10.5)

## 2017-09-13 LAB — URINALYSIS, ROUTINE W REFLEX MICROSCOPIC
Bilirubin Urine: NEGATIVE
GLUCOSE, UA: NEGATIVE mg/dL
Ketones, ur: NEGATIVE mg/dL
Leukocytes, UA: NEGATIVE
NITRITE: NEGATIVE
PH: 5 (ref 5.0–8.0)
Protein, ur: NEGATIVE mg/dL
SPECIFIC GRAVITY, URINE: 1.015 (ref 1.005–1.030)

## 2017-09-13 LAB — BASIC METABOLIC PANEL
Anion gap: 10 (ref 5–15)
BUN: 11 mg/dL (ref 6–20)
CALCIUM: 9 mg/dL (ref 8.9–10.3)
CHLORIDE: 106 mmol/L (ref 101–111)
CO2: 25 mmol/L (ref 22–32)
CREATININE: 0.84 mg/dL (ref 0.44–1.00)
Glucose, Bld: 125 mg/dL — ABNORMAL HIGH (ref 65–99)
Potassium: 3.5 mmol/L (ref 3.5–5.1)
SODIUM: 141 mmol/L (ref 135–145)

## 2017-09-13 MED ORDER — ONDANSETRON 4 MG PO TBDP
4.0000 mg | ORAL_TABLET | Freq: Once | ORAL | Status: AC
  Administered 2017-09-13: 4 mg via ORAL
  Filled 2017-09-13: qty 1

## 2017-09-13 MED ORDER — ACETAMINOPHEN 500 MG PO TABS
1000.0000 mg | ORAL_TABLET | Freq: Once | ORAL | Status: AC
  Administered 2017-09-13: 1000 mg via ORAL
  Filled 2017-09-13: qty 2

## 2017-09-13 NOTE — ED Triage Notes (Signed)
Patient c/o left sided flank pain radiating into abdomen onset of Saturday. Pt states hx of kidney stones. Patient took tylenol about 7pm tonight.   Pt adds she had steroid shot given last week, but reports this is unrelated.

## 2017-09-14 MED ORDER — HYDROCODONE-ACETAMINOPHEN 5-325 MG PO TABS: 1.0000 | ORAL_TABLET | ORAL | 0 refills | Status: DC | PRN

## 2017-09-14 MED ORDER — ONDANSETRON HCL 4 MG PO TABS: 4.0000 mg | ORAL_TABLET | Freq: Three times a day (TID) | ORAL | 0 refills | Status: DC | PRN

## 2017-09-14 NOTE — Discharge Instructions (Signed)
Return to the ED immediately if you develop fever, uncontrolled pain or vomiting, or other concerns. ° °

## 2017-09-14 NOTE — ED Provider Notes (Signed)
[facility]-EMERGENCY DEPT Provider Note   CSN: [id number] Arrival date & time: 09/13/17  2005     History   Chief Complaint Chief Complaint  Patient presents with  . Flank Pain    HPI Brandy Oconnell is a 76 y.o. female  He presents emergency Department with chief complaint of 3 days of intermittent left flank pain radiating into her abdomen. Around 2:30 PM today became acutely. She states that the pain was severe. She had associated nausea without vomiting. She is unable to tolerate opiate and took Tylenol with some relief her symptoms. She currently rates her pain as 7 out of 10 and she states that "the Tylenol has worn off." She denies fever or chills. Patient is also concerned because she had a recent spinal injection HPI  Past Medical History:  Diagnosis Date  . ALLERGIC RHINITIS   . Asthma   . Bronchiectasis   . Degenerative arthritis   . Depression   . Essential and other specified forms of tremor 09/13/2013  . Fibromyalgia   . Hyperlipidemia   . Hypertension   . Hypothyroidism   . Kidney stone   . Migraine   . Migraine without aura, without mention of intractable migraine without mention of status migrainosus 09/13/2013  . Obesity   . Palpitation   . Pelvic fracture [facility])     Patient Active Problem List   Diagnosis Date Noted  . Insomnia 10/18/2016  . Morbid obesity (HCC) 01/24/2016  . Normal coronary arteries 11/18/2013  . Obstructive chronic bronchitis without exacerbation (HCC) 10/09/2013  . Nightmares 10/09/2013  . Migraine without aura 09/13/2013  . Essential and other specified forms of tremor 09/13/2013  . SEBORRHEIC DERMATITIS 01/31/2011  . ACUTE BRONCHITIS 10/20/2010  . HYPOTHYROIDISM 08/31/2010  . Hyperlipidemia 08/31/2010  . Essential hypertension 08/31/2010  . Seasonal and perennial allergic rhinitis 08/31/2010  . Asthma, mild intermittent 08/31/2010    Past Surgical History:  Procedure Laterality Date  . gyn laparoscopy    . LAPAROSCOPIC  HYSTERECTOMY    . NASAL SEPTUM SURGERY    . VESICOVAGINAL FISTULA CLOSURE W/ TAH  1984    OB History    No data available       Home Medications    Prior to Admission medications   Medication Sig Start Date End Date Taking? Authorizing Provider  aspirin 81 MG tablet Take 81 mg by mouth daily.   Yes [provider]  calcium citrate (CALCITRATE - DOSED IN MG ELEMENTAL CALCIUM) 950 MG tablet Take 630 mg of elemental calcium by mouth daily.   Yes [provider]  famotidine (PEPCID) 10 MG tablet Take 10 mg by mouth 2 (two) times daily.   Yes [provider]  furosemide (LASIX) 40 MG tablet Take 20-40 mg by mouth daily as needed for fluid.    Yes [provider]  levothyroxine (SYNTHROID, LEVOTHROID) 112 MCG tablet Take 112 mcg by mouth daily.    Yes [provider]  loratadine (CLARITIN) 10 MG tablet Take 10 mg by mouth daily.   Yes [provider]  LORazepam (ATIVAN) 1 MG tablet Take 1 tablet (1 mg total) by mouth 2 (two) times daily as needed for anxiety. Patient taking differently: Take 1 mg by mouth at bedtime.  10/10/14  Yes Young, Clinton D, MD  rosuvastatin (CRESTOR) 10 MG tablet TAKE 1 TABLET BY MOUTH  TWICE WEEKLY AS DIRECTED 07/22/17  Yes Croitoru, Mihai, MD  valsartan (DIOVAN) 320 MG tablet Take 320 mg by mouth daily.  Yes [provider]  Vitamin D, Cholecalciferol, 1000 UNITS TABS Take 5,000 Units by mouth daily.    Yes [provider]  amoxicillin (AMOXIL) 500 MG capsule Take 1 capsule (500 mg total) by mouth 2 (two) times daily. Patient not taking: Reported on 09/13/2017 08/23/17   Waymon Budge, MD  benzonatate (TESSALON) 100 MG capsule Take 1 capsule (100 mg total) by mouth every 6 (six) hours as needed for cough. Patient not taking: Reported on 09/13/2017 11/22/16   Waymon Budge, MD  HYDROcodone-acetaminophen (NORCO) 5-325 MG tablet Take 1-2 tablets by mouth every 4 (four) hours as needed. 09/14/17    Arthor Captain, PA-C  ipratropium (ATROVENT) 0.06 % nasal spray 1-2 puffs each nostril up to 4 times daily as needed Patient not taking: Reported on 09/13/2017 10/20/16   Waymon Budge, MD  irbesartan (AVAPRO) 300 MG tablet Take 1 tablet (300 mg total) by mouth daily. Patient not taking: Reported on 09/13/2017 06/30/17   Croitoru, Mihai, MD  ondansetron (ZOFRAN) 4 MG tablet Take 1 tablet (4 mg total) by mouth every 8 (eight) hours as needed for nausea or vomiting. 09/14/17   Arthor Captain, PA-C    Family History Family History  Problem Relation Age of Onset  . Heart disease Brother   . Heart disease Mother   . Asthma Mother   . Kidney cancer Mother   . Cancer Mother   . Diabetes Mother   . Stroke Mother   . Tremor Mother   . Headache Mother   . Heart disease Sister   . Diabetes Sister   . Tremor Sister   . COPD Father   . Stroke Father   . Bladder Cancer Father   . Heart disease Maternal Grandmother   . Diabetes Maternal Grandmother   . Tremor Maternal Grandmother   . Heart attack Brother   . Hypertension Sister   . Heart attack Maternal Aunt   . Kidney cancer Paternal Grandfather     Social History Social History  Substance Use Topics  . Smoking status: Never Smoker  . Smokeless tobacco: Never Used  . Alcohol use No     Allergies   Erythromycin; Ace inhibitors; Beta adrenergic blockers; Codeine; Doxycycline; Amlodipine; Pneumovax 23 [pneumococcal vac polyvalent]; Sulfonamide derivatives; and Tramadol   Review of Systems Review of Systems   Physical Exam Updated Vital Signs BP (!) 155/58 (BP Location: Right Arm)   Pulse 75   Temp 97.7 F (36.5 C) (Oral)   Resp 20   Ht 5' 2.5" (1.588 m)   Wt 98 kg (216 lb)   SpO2 97%   BMI 38.88 kg/m   Physical Exam  Constitutional: She is oriented to person, place, and time. She appears well-developed and well-nourished. No distress.  Appears uncomfortable  HENT:  Head: Normocephalic and atraumatic.  Eyes:  Conjunctivae are normal. No scleral icterus.  Neck: Normal range of motion.  Cardiovascular: Normal rate, regular rhythm and normal heart sounds.  Exam reveals no gallop and no friction rub.   No murmur heard. Pulmonary/Chest: Effort normal and breath sounds normal. No respiratory distress.  Abdominal: Soft. Bowel sounds are normal. She exhibits no distension and no mass. There is no tenderness. There is no guarding.  No CVA tenderness  Musculoskeletal:  4 range of motion of the back. Ambulatory, normal strength in the bilateral lower extremities  Neurological: She is alert and oriented to person, place, and time.  Skin: Skin is warm and dry. She is not diaphoretic.  Psychiatric: Her behavior is normal.  Nursing note and vitals reviewed.    ED Treatments / Results  Labs (all labs ordered are listed, but only abnormal results are displayed) Labs Reviewed  URINALYSIS, ROUTINE W REFLEX MICROSCOPIC - Abnormal; Notable for the following:       Result Value   APPearance HAZY (*)    Hgb urine dipstick MODERATE (*)    Bacteria, UA RARE (*)    Squamous Epithelial / LPF 0-5 (*)    All other components within normal limits  BASIC METABOLIC PANEL - Abnormal; Notable for the following:    Glucose, Bld 125 (*)    All other components within normal limits  CBC - Abnormal; Notable for the following:    WBC 11.6 (*)    All other components within normal limits    EKG  EKG Interpretation None       Radiology Ct Renal Stone Study  Result Date: 09/14/2017 CLINICAL DATA:  LEFT flank pain, hematuria. History of kidney stones, hysterectomy. EXAM: CT ABDOMEN AND PELVIS WITHOUT CONTRAST TECHNIQUE: Multidetector CT imaging of the abdomen and pelvis was performed following the standard protocol without IV contrast. COMPARISON:  CT abdomen and pelvis August 14, 2014 FINDINGS: LOWER CHEST: Lung bases are clear. The visualized heart size is normal. No pericardial effusion. HEPATOBILIARY: Normal.  PANCREAS: Normal. SPLEEN: Normal. ADRENALS/URINARY TRACT: Kidneys are orthotopic, demonstrating normal size and morphology. RIGHT upper pole scarring. Mild LEFT hydroureteronephrosis to the ureterovesicular junction where a 3 mm calculus is present. Punctate nephrolithiasis. No RIGHT obstructive uropathy. Limited assessment for renal masses on this nonenhanced examination. Urinary bladder is partially distended and unremarkable. Normal adrenal glands. STOMACH/BOWEL: The stomach, small and large bowel are normal in course and caliber without inflammatory changes, sensitivity decreased by lack of enteric contrast. Normal appendix. VASCULAR/LYMPHATIC: Aortoiliac vessels are normal in course and caliber. Trace calcific atherosclerosis. No lymphadenopathy by CT size criteria. REPRODUCTIVE: Status post hysterectomy. OTHER: No intraperitoneal free fluid or free air. MUSCULOSKELETAL: Non-acute. Grade 1 L3-4 and grade 1 L4-5 anterolisthesis on degenerative basis. Advanced lower lumbar facet arthropathy. IMPRESSION: 1. 3 mm LEFT ureterovesicular junction calculus, mild residual obstructive uropathy. 2. Punctate bilateral nephrolithiasis. Electronically Signed   By: Awilda Metro M.D.   On: 09/14/2017 00:02    Procedures Procedures (including critical care time)  Medications Ordered in ED Medications  acetaminophen (TYLENOL) tablet 1,000 mg (1,000 mg Oral Given 09/13/17 2308)  ondansetron (ZOFRAN-ODT) disintegrating tablet 4 mg (4 mg Oral Given 09/13/17 2308)     Initial Impression / Assessment and Plan / ED Course  I have reviewed the triage vital signs and the nursing notes.  Pertinent labs & imaging results that were available during my care of the patient were reviewed by me and considered in my medical decision making (see chart for details).      Pt has been diagnosed with a Kidney Stone via CT. There is no evidence of significant hydronephrosis, serum creatine WNL, vitals sign stable and the pt  does not have irratractable vomiting. Pt will be dc home with pain medications & has been advised to follow up with PCP.   Final Clinical Impressions(s) / ED Diagnoses   Final diagnoses:  Ureterolithiasis    New Prescriptions Discharge Medication List as of 09/14/2017 12:12 AM    START taking these medications   Details  HYDROcodone-acetaminophen (NORCO) 5-325 MG tablet Take 1-2 tablets by mouth every 4 (four) hours as needed., Starting Wed 09/14/2017, Print    ondansetron [facility])  4 MG tablet Take 1 tablet (4 mg total) by mouth every 8 (eight) hours as needed for nausea or vomiting., Starting Wed 09/14/2017, Print         Tiburcio Pea [place], PA-C 09/14/17 0111    Azalia Bilis, MD 09/14/17 [phone]

## 2017-09-27 ENCOUNTER — Telehealth: Payer: Self-pay | Admitting: Cardiovascular Disease

## 2017-09-27 MED ORDER — VALSARTAN 320 MG PO TABS: 320.0000 mg | ORAL_TABLET | Freq: Every day | ORAL | 0 refills | Status: DC

## 2017-09-27 NOTE — Telephone Encounter (Signed)
No contraindications to change back to valsartan if supply available.  Okay to resume valsartan  24 hours after last Irbesartan  dose. Monitor BP 2-3 per week for 3 week and call if any adverse reaction or problems noted.  rx sent to [facility] as requested

## 2017-09-27 NOTE — Telephone Encounter (Signed)
New Message     Pt c/o medication issue:  1. Name of Medication:  Valsartan  2. How are you currently taking this medication (dosage and times per day)?  Not taking it right now  3. Are you having a reaction (difficulty breathing--STAT)? no  4. What is your medication issue? [facility] said that they are getting the Valsartan back in stock and she would like you to put her back on it

## 2017-09-27 NOTE — Telephone Encounter (Signed)
Returned call to patient. She states she would rather have valsartan  daily compared to irbesartan  daily. She states she just "feels bad" when she takes this medication. She feels lethargic. Advised would send to our pharmacy staff to [place] change back to valsartan . She states if for some reason [facility] does not have enough valsartan, she will continue on taking irbesartan. Notified her that [facility] may notify her if their stock is low, in which case she would need to be in touch with [place] to ensure she does not run out of her meds

## 2017-09-27 NOTE — Telephone Encounter (Signed)
Patient called with info noted below. She voiced understanding.

## 2017-10-03 ENCOUNTER — Ambulatory Visit
Admission: RE | Admit: 2017-10-03 | Discharge: 2017-10-03 | Disposition: A | Discharge status: Home / Self Care | Payer: Medicare Other | Source: Ambulatory Visit | Attending: Endocrinology | Admitting: Endocrinology

## 2017-10-03 DIAGNOSIS — Z1231 Encounter for screening mammogram for malignant neoplasm of breast: Secondary | ICD-10-CM

## 2017-10-04 ENCOUNTER — Encounter (HOSPITAL_COMMUNITY)
Admission: RE | Disposition: A | Discharge status: Home / Self Care | Payer: Self-pay | Source: Ambulatory Visit | Attending: Urology

## 2017-10-04 ENCOUNTER — Other Ambulatory Visit: Payer: Self-pay | Admitting: Urology

## 2017-10-04 ENCOUNTER — Encounter (HOSPITAL_COMMUNITY): Payer: Self-pay | Admitting: *Deleted

## 2017-10-04 ENCOUNTER — Ambulatory Visit (HOSPITAL_COMMUNITY): Payer: Medicare Other | Admitting: Anesthesiology

## 2017-10-04 ENCOUNTER — Ambulatory Visit (HOSPITAL_COMMUNITY): Payer: Medicare Other

## 2017-10-04 ENCOUNTER — Ambulatory Visit (HOSPITAL_COMMUNITY)
Admission: RE | Admit: 2017-10-04 | Discharge: 2017-10-04 | Disposition: A | Discharge status: Home / Self Care | Payer: Medicare Other | Source: Ambulatory Visit | Attending: Urology | Admitting: Urology

## 2017-10-04 DIAGNOSIS — Z881 Allergy status to other antibiotic agents status: Secondary | ICD-10-CM | POA: Insufficient documentation

## 2017-10-04 DIAGNOSIS — E039 Hypothyroidism, unspecified: Secondary | ICD-10-CM | POA: Diagnosis not present

## 2017-10-04 DIAGNOSIS — Z887 Allergy status to serum and vaccine status: Secondary | ICD-10-CM | POA: Diagnosis not present

## 2017-10-04 DIAGNOSIS — K760 Fatty (change of) liver, not elsewhere classified: Secondary | ICD-10-CM | POA: Insufficient documentation

## 2017-10-04 DIAGNOSIS — M47816 Spondylosis without myelopathy or radiculopathy, lumbar region: Secondary | ICD-10-CM | POA: Insufficient documentation

## 2017-10-04 DIAGNOSIS — Z7982 Long term (current) use of aspirin: Secondary | ICD-10-CM | POA: Diagnosis not present

## 2017-10-04 DIAGNOSIS — J45909 Unspecified asthma, uncomplicated: Secondary | ICD-10-CM | POA: Diagnosis not present

## 2017-10-04 DIAGNOSIS — M5136 Other intervertebral disc degeneration, lumbar region: Secondary | ICD-10-CM | POA: Diagnosis not present

## 2017-10-04 DIAGNOSIS — G250 Essential tremor: Secondary | ICD-10-CM | POA: Insufficient documentation

## 2017-10-04 DIAGNOSIS — Z6839 Body mass index (BMI) 39.0-39.9, adult: Secondary | ICD-10-CM | POA: Diagnosis not present

## 2017-10-04 DIAGNOSIS — Z8249 Family history of ischemic heart disease and other diseases of the circulatory system: Secondary | ICD-10-CM | POA: Insufficient documentation

## 2017-10-04 DIAGNOSIS — F329 Major depressive disorder, single episode, unspecified: Secondary | ICD-10-CM | POA: Diagnosis not present

## 2017-10-04 DIAGNOSIS — E785 Hyperlipidemia, unspecified: Secondary | ICD-10-CM | POA: Diagnosis not present

## 2017-10-04 DIAGNOSIS — Z882 Allergy status to sulfonamides status: Secondary | ICD-10-CM | POA: Insufficient documentation

## 2017-10-04 DIAGNOSIS — I10 Essential (primary) hypertension: Secondary | ICD-10-CM | POA: Diagnosis not present

## 2017-10-04 DIAGNOSIS — Z885 Allergy status to narcotic agent status: Secondary | ICD-10-CM | POA: Insufficient documentation

## 2017-10-04 DIAGNOSIS — I700 Atherosclerosis of aorta: Secondary | ICD-10-CM | POA: Insufficient documentation

## 2017-10-04 DIAGNOSIS — Z90710 Acquired absence of both cervix and uterus: Secondary | ICD-10-CM | POA: Insufficient documentation

## 2017-10-04 DIAGNOSIS — Z91040 Latex allergy status: Secondary | ICD-10-CM | POA: Insufficient documentation

## 2017-10-04 DIAGNOSIS — M797 Fibromyalgia: Secondary | ICD-10-CM | POA: Diagnosis not present

## 2017-10-04 DIAGNOSIS — Z79899 Other long term (current) drug therapy: Secondary | ICD-10-CM | POA: Diagnosis not present

## 2017-10-04 DIAGNOSIS — Z419 Encounter for procedure for purposes other than remedying health state, unspecified: Secondary | ICD-10-CM

## 2017-10-04 DIAGNOSIS — N132 Hydronephrosis with renal and ureteral calculous obstruction: Secondary | ICD-10-CM | POA: Diagnosis not present

## 2017-10-04 DIAGNOSIS — Z888 Allergy status to other drugs, medicaments and biological substances status: Secondary | ICD-10-CM | POA: Diagnosis not present

## 2017-10-04 HISTORY — DX: Adverse effect of unspecified anesthetic, initial encounter: T41.45XA

## 2017-10-04 HISTORY — DX: Other complications of anesthesia, initial encounter: T88.59XA

## 2017-10-04 HISTORY — DX: Nausea with vomiting, unspecified: R11.2

## 2017-10-04 HISTORY — PX: CYSTOSCOPY WITH RETROGRADE PYELOGRAM, URETEROSCOPY AND STENT PLACEMENT: SHX5789

## 2017-10-04 HISTORY — DX: Other specified postprocedural states: Z98.890

## 2017-10-04 SURGERY — CYSTOURETEROSCOPY, WITH RETROGRADE PYELOGRAM AND STENT INSERTION
Anesthesia: General | Laterality: Left

## 2017-10-04 MED ORDER — IOHEXOL 300 MG/ML  SOLN
INTRAMUSCULAR | Status: DC | PRN
  Administered 2017-10-04: 20 mL

## 2017-10-04 MED ORDER — FENTANYL CITRATE (PF) 100 MCG/2ML IJ SOLN: 25.0000 ug | INTRAMUSCULAR | Status: DC | PRN

## 2017-10-04 MED ORDER — KETOROLAC TROMETHAMINE 30 MG/ML IJ SOLN
INTRAMUSCULAR | Status: AC
  Filled 2017-10-04: qty 1

## 2017-10-04 MED ORDER — FENTANYL CITRATE (PF) 100 MCG/2ML IJ SOLN
INTRAMUSCULAR | Status: DC | PRN
  Administered 2017-10-04: 50 ug via INTRAVENOUS

## 2017-10-04 MED ORDER — PROMETHAZINE HCL 25 MG/ML IJ SOLN
INTRAMUSCULAR | Status: AC
  Filled 2017-10-04: qty 1

## 2017-10-04 MED ORDER — LIDOCAINE HCL (CARDIAC) 20 MG/ML IV SOLN
INTRAVENOUS | Status: DC | PRN
  Administered 2017-10-04: 80 mg via INTRAVENOUS

## 2017-10-04 MED ORDER — SCOPOLAMINE 1 MG/3DAYS TD PT72
MEDICATED_PATCH | TRANSDERMAL | Status: AC
  Filled 2017-10-04: qty 1

## 2017-10-04 MED ORDER — PROPOFOL 10 MG/ML IV BOLUS
INTRAVENOUS | Status: AC
  Filled 2017-10-04: qty 20

## 2017-10-04 MED ORDER — SODIUM CHLORIDE 0.9 % IR SOLN
Status: DC | PRN
  Administered 2017-10-04: 4000 mL via INTRAVESICAL

## 2017-10-04 MED ORDER — SCOPOLAMINE 1 MG/3DAYS TD PT72
MEDICATED_PATCH | TRANSDERMAL | Status: DC | PRN
  Administered 2017-10-04: 1 via TRANSDERMAL

## 2017-10-04 MED ORDER — PROMETHAZINE HCL 25 MG/ML IJ SOLN: 6.2500 mg | INTRAMUSCULAR | Status: DC | PRN

## 2017-10-04 MED ORDER — CIPROFLOXACIN IN D5W 400 MG/200ML IV SOLN
400.0000 mg | Freq: Once | INTRAVENOUS | Status: AC
  Administered 2017-10-04: 400 mg via INTRAVENOUS
  Filled 2017-10-04: qty 200

## 2017-10-04 MED ORDER — PHENYLEPHRINE HCL 10 MG/ML IJ SOLN
INTRAMUSCULAR | Status: DC | PRN
  Administered 2017-10-04: 80 ug via INTRAVENOUS
  Administered 2017-10-04: 120 ug via INTRAVENOUS
  Administered 2017-10-04: 80 ug via INTRAVENOUS

## 2017-10-04 MED ORDER — ONDANSETRON HCL 4 MG/2ML IJ SOLN
INTRAMUSCULAR | Status: DC | PRN
  Administered 2017-10-04: 4 mg via INTRAVENOUS

## 2017-10-04 MED ORDER — PROPOFOL 10 MG/ML IV BOLUS
INTRAVENOUS | Status: DC | PRN
  Administered 2017-10-04: 150 mg via INTRAVENOUS

## 2017-10-04 MED ORDER — FENTANYL CITRATE (PF) 100 MCG/2ML IJ SOLN
INTRAMUSCULAR | Status: AC
  Filled 2017-10-04: qty 2

## 2017-10-04 MED ORDER — DEXAMETHASONE SODIUM PHOSPHATE 10 MG/ML IJ SOLN
INTRAMUSCULAR | Status: DC | PRN
  Administered 2017-10-04: 10 mg via INTRAVENOUS

## 2017-10-04 MED ORDER — KETOROLAC TROMETHAMINE 30 MG/ML IJ SOLN
30.0000 mg | Freq: Once | INTRAMUSCULAR | Status: DC | PRN
  Administered 2017-10-04: 30 mg via INTRAVENOUS

## 2017-10-04 MED ORDER — ONDANSETRON HCL 4 MG/2ML IJ SOLN
INTRAMUSCULAR | Status: AC
  Filled 2017-10-04: qty 2

## 2017-10-04 MED ORDER — LACTATED RINGERS IV SOLN
INTRAVENOUS | Status: DC
  Administered 2017-10-04: 15:00:00 via INTRAVENOUS

## 2017-10-04 MED ORDER — SUCCINYLCHOLINE CHLORIDE 200 MG/10ML IV SOSY
PREFILLED_SYRINGE | INTRAVENOUS | Status: AC
Start: 2017-10-04 — End: ?
  Filled 2017-10-04: qty 10

## 2017-10-04 MED ORDER — SUCCINYLCHOLINE CHLORIDE 20 MG/ML IJ SOLN
INTRAMUSCULAR | Status: DC | PRN
  Administered 2017-10-04: 180 mg via INTRAVENOUS

## 2017-10-04 SURGICAL SUPPLY — 15 items
BAG URO CATCHER STRL LF (MISCELLANEOUS) ×3 IMPLANT
BASKET ZERO TIP NITINOL 2.4FR (BASKET) ×3 IMPLANT
BSKT STON RTRVL ZERO TP 2.4FR (BASKET) ×1
CATH INTERMIT  6FR 70CM (CATHETERS) ×3 IMPLANT
CLOTH BEACON ORANGE TIMEOUT ST (SAFETY) ×3 IMPLANT
COVER FOOTSWITCH UNIV (MISCELLANEOUS) IMPLANT
COVER SURGICAL LIGHT HANDLE (MISCELLANEOUS) ×3 IMPLANT
GLOVE BIOGEL M STRL SZ7.5 (GLOVE) ×3 IMPLANT
GOWN STRL REUS W/TWL LRG LVL3 (GOWN DISPOSABLE) ×6 IMPLANT
GUIDEWIRE STR DUAL SENSOR (WIRE) ×3 IMPLANT
MANIFOLD NEPTUNE II (INSTRUMENTS) ×3 IMPLANT
PACK CYSTO (CUSTOM PROCEDURE TRAY) ×3 IMPLANT
STENT URET 6FRX26 CONTOUR (STENTS) ×3 IMPLANT
TUBING CONNECTING 10 (TUBING) ×2 IMPLANT
TUBING CONNECTING 10' (TUBING) ×1

## 2017-10-04 NOTE — H&P (Signed)
Urology H+P Note    Chief Complaint:  Left flank pain  HPI: Brandy Oconnell is a 76 y.o. female with a 60m distal left ureteral stone, failed MET. Here today for ureteroscopic stone extraction. No recent f/c/n/v. Consent signed.    Past Medical History: Past Medical History:  Diagnosis Date  . ALLERGIC RHINITIS   . Asthma   . Bronchiectasis   . Complication of anesthesia    BP bottomed out and they had to put IV back in me.  . Degenerative arthritis   . Depression   . Essential and other specified forms of tremor 09/13/2013  . Fibromyalgia   . Hyperlipidemia   . Hypertension   . Hypothyroidism   . Kidney stone   . Migraine   . Migraine without aura, without mention of intractable migraine without mention of status migrainosus 09/13/2013  . Obesity   . Palpitation   . Pelvic fracture ([facility]   . PONV (postoperative nausea and vomiting)     Past Surgical History:  Past Surgical History:  Procedure Laterality Date  . gyn laparoscopy    . LAPAROSCOPIC HYSTERECTOMY    . NASAL SEPTUM SURGERY    . VESICOVAGINAL FISTULA CLOSURE W/ TAH  1984    Medication: Current Facility-Administered Medications  Medication Dose Route Frequency Provider Last Rate Last Dose  . ciprofloxacin (CIPRO) IVPB 400 mg  400 mg Intravenous Once BLucas Mallow MD      . lactated ringers infusion   Intravenous Continuous RMyrtie Soman MD 75 mL/hr at 10/04/17 1522     Facility-Administered Medications Ordered in Other Encounters  Medication Dose Route Frequency Provider Last Rate Last Dose  . scopolamine (TRANSDERM-SCOP) 1 MG/3DAYS    Anesthesia Intra-op SGlory Buff CRNA   1 patch at 10/04/17 1545    Allergies: Allergies  Allergen Reactions  . Erythromycin   . Latex Shortness Of Breath and Cough  . Ace Inhibitors Cough  . Beta Adrenergic Blockers Swelling and Cough  . Codeine Nausea And Vomiting  . Doxycycline Nausea And Vomiting  . Tape Other (See Comments)    blisters  .  Amlodipine Swelling  . Pneumovax 23 [Pneumococcal Vac Polyvalent] Rash    Large local reaction-rash. (2008)  . Sulfonamide Derivatives Hives and Rash  . Tramadol Other (See Comments)    REACTION: Nightmares    Social History: Social History  Substance Use Topics  . Smoking status: Never Smoker  . Smokeless tobacco: Never Used  . Alcohol use No    Family History Family History  Problem Relation Age of Onset  . Heart disease Brother   . Heart disease Mother   . Asthma Mother   . Kidney cancer Mother   . Cancer Mother   . Diabetes Mother   . Stroke Mother   . Tremor Mother   . Headache Mother   . Heart disease Sister   . Diabetes Sister   . Tremor Sister   . COPD Father   . Stroke Father   . Bladder Cancer Father   . Heart disease Maternal Grandmother   . Diabetes Maternal Grandmother   . Tremor Maternal Grandmother   . Heart attack Brother   . Hypertension Sister   . Heart attack Maternal Aunt   . Kidney cancer Paternal Grandfather     Review of Systems 10 systems were reviewed and are negative except as noted specifically in the HPI.  Objective   Vital signs in last 24 hours: BP (!) 175/68  Pulse 94   Temp 97.7 F (36.5 C) (Oral)   Resp 20   Ht 5' 2.5" (1.588 m)   Wt 99.8 kg (220 lb)   SpO2 93%   BMI 39.60 kg/m   Physical Exam General: NAD, A&O, resting, appropriate HEENT: [place]/AT, EOMI, MMM Pulmonary: Normal work of breathing Cardiovascular: HDS, adequate peripheral perfusion Abdomen: Soft, TTP over LLQ, nondistended. GU: No CVA tenderness Extremities: warm and well perfused Neuro: Appropriate, no focal neurological deficits  Most Recent Labs: Lab Results  Component Value Date   WBC 11.6 (H) 09/13/2017   HGB 13.7 09/13/2017   HCT 40.3 09/13/2017   PLT 266 09/13/2017    Lab Results  Component Value Date   NA 141 09/13/2017   K 3.5 09/13/2017   CL 106 09/13/2017   CO2 25 09/13/2017   BUN 11 09/13/2017   CREATININE 0.84 09/13/2017    CALCIUM 9.0 09/13/2017    No results found for: INR, APTT   IMAGING: Mm Screening Breast Tomo Bilateral  Result Date: 10/03/2017 CLINICAL DATA:  Screening. EXAM: 2D DIGITAL SCREENING BILATERAL MAMMOGRAM WITH CAD AND ADJUNCT TOMO COMPARISON:  Previous exam(s). ACR Breast Density Category b: There are scattered areas of fibroglandular density. FINDINGS: In the left breast, a possible mass warrants further evaluation. In the right breast, no findings suspicious for malignancy. Images were processed with CAD. IMPRESSION: Further evaluation is suggested for possible mass in the left breast. RECOMMENDATION: Diagnostic mammogram and possibly ultrasound of the left breast. (Code:[id number]) The patient will be contacted regarding the findings, and additional imaging will be scheduled. BI-RADS CATEGORY  0: Incomplete. Need additional imaging evaluation and/or prior mammograms for comparison. Electronically Signed   By: Lajean Manes M.D.   On: 10/03/2017 14:31    ------  Assessment:  76 y.o. female with a 82m left distal ureteral stone with failed MET   Plan: - Proceed to OR as planned.

## 2017-10-04 NOTE — Transfer of Care (Signed)
Immediate Anesthesia Transfer of Care Note  Patient: Brandy Oconnell  Procedure(s) Performed: CYSTOSCOPY WITH RETROGRADE PYELOGRAM, URETEROSCOPY AND STENT PLACEMENT (Left )  Patient Location: PACU  Anesthesia Type:General  Level of Consciousness: awake, alert  and oriented  Airway & Oxygen Therapy: Patient Spontanous Breathing and Patient connected to face mask oxygen  Post-op Assessment: Report given to RN and Post -op Vital signs reviewed and stable  Post vital signs: Reviewed and stable  Last Vitals:  Vitals:   10/04/17 1421 10/04/17 1438  BP:  (!) 175/68  Pulse: 94   Resp: 20   Temp: 36.5 C   SpO2: 93%     Last Pain:  Vitals:   10/04/17 1517  TempSrc:   PainSc: 4       Patients Stated Pain Goal: 4 (10/04/17 1517)  Complications: No apparent anesthesia complications

## 2017-10-04 NOTE — Anesthesia Preprocedure Evaluation (Addendum)
Anesthesia Evaluation  Patient identified by MRN, date of birth, ID band Patient awake    Reviewed: Allergy & Precautions, NPO status , Patient's Chart, lab work & pertinent test results  Airway Mallampati: II  TM Distance: >3 FB Neck ROM: Full    Dental no notable dental hx.    Pulmonary neg pulmonary ROS,    Pulmonary exam normal breath sounds clear to auscultation       Cardiovascular hypertension, Normal cardiovascular exam Rhythm:Regular Rate:Normal     Neuro/Psych negative neurological ROS  negative psych ROS   GI/Hepatic negative GI ROS, Neg liver ROS,   Endo/Other  Hypothyroidism Morbid obesity  Renal/GU negative Renal ROS  negative genitourinary   Musculoskeletal negative musculoskeletal ROS (+)   Abdominal   Peds negative pediatric ROS (+)  Hematology negative hematology ROS (+)   Anesthesia Other Findings   Reproductive/Obstetrics negative OB ROS                             Anesthesia Physical Anesthesia Plan  ASA: III  Anesthesia Plan: General   Post-op Pain Management:    Induction: Intravenous  PONV Risk Score and Plan: 2 and Ondansetron, Dexamethasone and Treatment may vary due to age or medical condition  Airway Management Planned: Oral ETT and LMA  Additional Equipment:   Intra-op Plan:   Post-operative Plan: Extubation in OR  Informed Consent: I have reviewed the patients History and Physical, chart, labs and discussed the procedure including the risks, benefits and alternatives for the proposed anesthesia with the patient or authorized representative who has indicated his/her understanding and acceptance.   Dental advisory given  Plan Discussed with: CRNA and Surgeon  Anesthesia Plan Comments:         Anesthesia Quick Evaluation

## 2017-10-04 NOTE — Anesthesia Procedure Notes (Signed)
Procedure Name: Intubation Date/Time: 10/04/2017 4:34 PM Performed by: Thornell MuleSTUBBLEFIELD, Lydiana Milley G Pre-anesthesia Checklist: Patient identified, Emergency Drugs available, Suction available and Patient being monitored Patient Re-evaluated:Patient Re-evaluated prior to induction Oxygen Delivery Method: Circle system utilized Preoxygenation: Pre-oxygenation with 100% oxygen Induction Type: IV induction Ventilation: Mask ventilation without difficulty Laryngoscope Size: Glidescope and 3 Grade View: Grade I Tube type: Oral Tube size: 7.0 mm Number of attempts: 1 Airway Equipment and Method: Stylet and Oral airway Placement Confirmation: ETT inserted through vocal cords under direct vision,  positive ETCO2 and breath sounds checked- equal and bilateral Secured at: 21 cm Tube secured with: Tape Dental Injury: Teeth and Oropharynx as per pre-operative assessment

## 2017-10-04 NOTE — Op Note (Signed)
Operative Note  Preoperative diagnosis:  1. Left ureteral calculus  Postoperative diagnosis: 1. Left ureteral calculus  Procedure(s): 1. Cystoscopy with left ureteroscopic stone extraction, left retrograde pyelogram, left ureteral stent placement  Surgeon: Modena SlaterEugene Bell, MD  Assistants: None  Anesthesia: Gen.  Complications: None immediate  EBL: Minimal  Specimens: 1. Renal calculus  Drains/Catheters: 1. 6 x 24 double-J ureteral stent  Intraoperative findings: 1. Normal urethra and bladder 2. 5 mm distal ureteral calculus 3. Retrograde pyelogram revealed upstream hydroureteronephrosis but no significant filling defect was obvious  Indication: 76 year old female who fell trial passage of a ureteral calculus. She presented to the clinic today with refractory pain and nausea and the decision was made to admit her to the hospital and proceed with the aformentioned operation.  Description of procedure:  The patient was identified and consent was obtained. She was taken back to the operating room and placed in the supine position. She was placed under general anesthesia. She was converted to dorsal lithotomy. She was prepped and draped in standard sterile fashion. Ciprofloxacin was administered. Timeout was performed. I first advanced a 4422 French rigid cystoscope into the urethra and into the bladder performed a complete cystoscopy with findings noted above. I advanced a sensor wire up the left ureter under fluoroscopic guidance up into the kidney. I then advanced a semirigid ureteroscope alongside the wire into the distal ureter and identified the stone of interest. This was basket extracted gently. I collected for specimen. I then readvanced the scope into the ureter and there was no evidence of any injury. There was some inflammation around the area of the distal ureter from the area of impaction. There was upstream hydronephrosis and the scope easily passed up to the renal pelvis. There  were no other ureteral calculi or ureteral injury or stricture seen. I shot a retrograde pyelogram through the scope with findings noted above. I then withdrew the scope and advanced a 6 x 24 double-J ureteral stent over the wire under fluoroscopic guidance and removed the wire. Fluoroscopy confirmed proximal and distal placement. Cystoscopy showed a good distal coil in the bladder and the bladder was drained and the scope withdrawn. This concluded the operation the patient tolerated procedure well and was stable postoperatively.  Plan: The patient will remove her stent on Monday morning. She will follow up in 4 weeks with a renal ultrasound and KUB.

## 2017-10-04 NOTE — H&P (Signed)
CC: I have ureteral stone.  HPI: Brandy Oconnell is a 76 year-old female established patient who is here for ureteral stone.  Has been having intermittent pain for 1 week and now today with n/v and uncontrollable pain 10/10.    The problem is on the left side. She first stated noticing pain on 09/13/2017. This is not her first kidney stone. She is currently having flank pain, back pain, nausea, and vomiting. She denies having groin pain, fever, and chills. Pain is occuring on the left side. She has not caught a stone in her urine strainer since her symptoms began.   ALLERGIES: Ace Inhibitors amlodipine Beta Adrenergic Blockers Codeine Derivatives - Vomiting, Nausea Doxycycline Erythromycin Derivatives - Other Reaction, chest pain Pneumococcal Vac Polyvalent Sulfonamide Derivatives - Skin Rash tamsulosin - Other Reaction, chest pain TraMADol HCl TABS - Other Reaction, hallucinations.   MEDICATIONS: Aspir 81 81 mg tablet, delayed release Oral  Calcium 600 mg calcium (1,500 mg) tablet Oral  Furosemide 40 mg tablet  Levothyroxine Sodium 112 mcg tablet Oral  Loratadine 10 mg tablet Oral  Lorazepam 1 mg tablet Oral  Valsartan 320 mg tablet    GU PSH: Hysterectomy Unilat SO - 2015     PSH Notes: ESI 09/08/17   NON-GU PSH: Adhesiolysis (laparoscopic)-Inactive - 2015 Nose Surgery (Unspecified) Repair Bladder-vagina Lesion - 2015   GU PMH: Ureteral calculus (Improving, Acute), Left, Continue straining urine. F/U in 2 weeks. She may use OTC Tylenol/NSAID for pain and may use PRN Oxycodone she has on hand from ER. If stone captured bring to office. Instructed to contact office if she has any acute changes ie temp >100.5, vomiting, or chills. - 09/15/2017, Ureteral calculus, right, - 2015 Renal calculus, Renal calculus, right - 2016   NON-GU PMH: Encounter for general adult medical examination without abnormal findings, Encounter for preventive health examination - 2016 Palpitations, Palpitations  - 2015 Personal history of (healed) traumatic fracture, History of fracture of pelvis - 2015 Personal history of other diseases of the circulatory system, History of hypertension - 2015 Personal history of other diseases of the musculoskeletal system and connective tissue, History of osteoarthritis - 2015, History of fibromyalgia, - 2015 Personal history of other diseases of the nervous system and sense organs, History of migraine headaches - 2015 Personal history of other diseases of the respiratory system, History of bronchiectasis - 2015, History of asthma, - 2015 Personal history of other endocrine, nutritional and metabolic disease, History of hypothyroidism - 2015, History of hyperlipidemia, - 2015, History of obesity, - 2015 Personal history of other mental and behavioral disorders, History of depression - 2015 Tremor, unspecified, Tremor - 2015   FAMILY HISTORY: Asthma - Runs In Family Bladder Cancer - Runs in Family Carcinoma, renal cell - Runs In Family cardiac disorder - Runs In Family Chronic Obstructive Pulmonary Disease - Runs In Family Diabetes - Runs In Family Kidney Cancer - Runs In Family Strokes - Runs In Family tremor - Runs In Family   SOCIAL HISTORY: Marital Status: Single Preferred Language: English; Ethnicity: Not Hispanic Or Latino; Race: White Current Smoking Status: Patient has never smoked.   Tobacco Use Assessment Completed:  Used Tobacco in last 30 days?  Does not use smokeless tobacco. Has never drank.  Does not use drugs. Does not drink caffeine.   REVIEW OF SYSTEMS:    GU Review Female:   Patient reports frequent urination and trouble starting your stream. Patient denies hard to postpone urination, burning /pain with urination, get up at  night to urinate, leakage of urine, stream starts and stops, have to strain to urinate, and being pregnant.  Gastrointestinal (Upper):   Patient reports nausea and vomiting. Patient denies indigestion/ heartburn.   Gastrointestinal (Lower):   Patient denies diarrhea and constipation.  Constitutional:   Patient denies fever, night sweats, weight loss, and fatigue.  Skin:   Patient denies skin rash/ lesion and itching.  Eyes:   Patient denies blurred vision and double vision.  Ears/ Nose/ Throat:   Patient denies sore throat and sinus problems.  Hematologic/Lymphatic:   Patient reports easy bruising. Patient denies swollen glands.  Cardiovascular:   Patient denies chest pains and leg swelling.  Respiratory:   Patient denies cough and shortness of breath.  Endocrine:   Patient denies excessive thirst.  Musculoskeletal:   Patient reports back pain. Patient denies joint pain.  Neurological:   Patient denies headaches and dizziness.  Psychologic:   Patient denies depression and anxiety.   VITAL SIGNS:      10/04/2017 11:04 AM  Weight 216 lb / 97.98 kg  Height 62.5 in / 158.75 cm  BP 159/82 mmHg  Pulse 76 /min  Temperature 97.3 F / 36.2 C  BMI 38.9 kg/m   MULTI-SYSTEM PHYSICAL EXAMINATION:    Constitutional: Obese. No physical deformities. Normally developed. Good grooming.   Respiratory: No labored breathing, no use of accessory muscles.   Cardiovascular: Normal temperature, normal extremity pulses, no swelling, no varicosities.   Skin: No paleness, no jaundice, no cyanosis. No lesion, no ulcer, no rash.   Neurologic / Psychiatric: Oriented to time, oriented to place, oriented to person. No depression, no anxiety, no agitation.   Gastrointestinal: Obese abdomen. No mass, no tenderness, no rigidity. (L) CVAT  Musculoskeletal: Normal gait and station of head and neck.    PAST DATA REVIEWED:  Source Of History:  Patient, Family/Caregiver  Records Review:   Previous Patient Records  Urine Test Review:   Urinalysis  X-Ray Review: KUB: Reviewed Films.    PROCEDURES:         C.T. Urogram - [id number]      Moderate left hydronephrosis and dilated proximal ureter. Stable distal left UVJ calculus  unchanged.   IMPRESSION:  1. The 4 by 3 mm left UVJ calculus remains in place and is  associated with left hydroureter, moderate left hydronephrosis, and  worsened left perirenal stranding.  2. Several stable calcifications in the right kidney upper pole  adjacent to a region of scarring, I favor at least 1 calculus in  this grouping although some of the calcification could be dystrophic  in nature.  3. Aortic Atherosclerosis (ICD10-I70.0).  4. Lumbar spondylosis and degenerative disc disease without observed  impingement.  5. Diffuse hepatic steatosis.         KUB - [id number]  A single view of the abdomen is obtained.      No significant change at this time in distal left UVJ calculus from previous KUB and CT.        Urinalysis w/Scope - 81001 Dipstick Dipstick Cont'd Micro  Specimen: Voided Bilirubin: Neg WBC/hpf: NS (Not Seen)  Color: Yellow Ketones: Trace RBC/hpf: 3 - 10/hpf  Appearance: Clear Blood: 2+ Bacteria: Mod (26-50/hpf)  Specific Gravity: 1.020 Protein: Neg Cystals: NS (Not Seen)  pH: <=5.0 Urobilinogen: 0.2 Casts: NS (Not Seen)  Glucose: Neg Nitrites: Neg Trichomonas: Not Present    Leukocyte Esterase: Neg Mucous: Not Present      Epithelial Cells: 10 - 20/hpf  Yeast: NS (Not Seen)      Sperm: Not Present        Ketoralac 30mg  - [id number], [id number] Area prepped with alcohol. Medication administered. Pt tolerated well.   Qty: 30 Adm. By: Artis Flockeborah Allman Lucas  Unit: mg Lot No [id number]  Route: IM Exp. Date 05/14/2019  Freq: None Mfgr.:   Site: Left Buttock        Phenergan 25mg  - [id number], [id number] Site was cleaned with alcohol. Medication was administered. Pt tolerated well.   Qty: 25 Adm. By: Artis Flockeborah Allman Lucas  Unit: mg Lot No [id number]  Route: IM Exp. 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 No change in distal left UVJ  caclculus and at this time with pain and vomiting pt would like to proceed with urgent cystourethroscopy, (L) RPG, stone extraction, and possible double J stent placement by on call (Dr. Alvester MorinBell).   2 NON-GU:   Nausea with vomiting, unspecified - R11.2 Acute - Promethazine 25 mg IM today.    PLAN:          Orders X-Rays: C.T. Stone Protocol Without Contrast  X-Ray Notes: History:  Hematuria: Yes/No  Patient to see MD after exam: Yes/No  Previous exam: CT / IVP/ US/ KUB/ None  When:  Where:  Diabetic: Yes/ No  BUN/ Creatinine:  Date of last BUN Creatinine:  Weight in pounds:  Allergy- IV Contrast: Yes/ No  Conflicting diabetic meds: Yes/ No  Diabetic Meds:  Prior Authorization #: [id number]         Schedule          Document Letter(s):  Created for Patient: Clinical Summary        Notes:   Discussed surgical procedure and risks/benefits with both pt and her sister of general anesthesia, risk or infection and hematuria, and possible injury to bladder or ureter. Understands she may have a stent post procedure. All questions answered and she wishes to proceed at this time.

## 2017-10-04 NOTE — Interval H&P Note (Signed)
History and Physical Interval Note:  10/04/2017 4:22 PM  Brandy Oconnell  has presented today for surgery, with the diagnosis of left ureteral calculus  The various methods of treatment have been discussed with the patient and family. After consideration of risks, benefits and other options for treatment, the patient has consented to  Procedure(s): CYSTOSCOPY WITH RETROGRADE PYELOGRAM, URETEROSCOPY AND STENT PLACEMENT (Left) as a surgical intervention .  The patient's history has been reviewed, patient examined, no change in status, stable for surgery.  I have reviewed the patient's chart and labs.  Questions were answered to the patient's satisfaction.     Ray ChurchEugene D Bell, III

## 2017-10-04 NOTE — Discharge Instructions (Addendum)
[facility] [id number] Post Ureteroscopy With or Without Stent Instructions   REMOVE YOUR STENT ON Monday MORNING  Definitions:  Ureter: The duct that transports urine from the kidney to the bladder. Stent:   A plastic hollow tube that is placed into the ureter, from the kidney to the                 bladder to prevent the ureter from swelling shut.  GENERAL INSTRUCTIONS:  Despite the fact that no skin incisions were used, the area around the ureter and bladder is raw and irritated. The stent is a foreign body which will further irritate the bladder wall. This irritation is manifested by increased frequency of urination, both day and night, and by an increase in the urge to urinate. In some, the urge to urinate is present almost always. Sometimes the urge is strong enough that you may not be able to stop yourself from urinating. The only real cure is to remove the stent and then give time for the bladder wall to heal which can't be done until the danger of the ureter swelling shut has passed, which varies.  You may see some blood in your urine while the stent is in place and a few days afterwards. Do not be alarmed, even if the urine was clear for a while. Get off your feet and drink lots of fluids until clearing occurs. If you start to pass clots or don't improve, call us.  DIET: You may return to your normal diet immediately. Because of the raw surface of your bladder, alcohol, spicy foods, acid type foods and drinks with caffeine may cause irritation or frequency and should be used in moderation. To keep your urine flowing freely and to avoid constipation, drink plenty of fluids during the day ( 8-10 glasses ). Tip: Avoid cranberry juice because it is very acidic.  ACTIVITY: Your physical activity doesn't need to be restricted. However, if you are very active, you may see some blood in your urine. We suggest that you reduce your activity under these circumstances until  the bleeding has stopped.  BOWELS: It is important to keep your bowels regular during the postoperative period. Straining with bowel movements can cause bleeding. A bowel movement every other day is reasonable. Use a mild laxative if needed, such as Milk of Magnesia 2-3 tablespoons, or 2 Dulcolax tablets. Call if you continue to have problems. If you have been taking narcotics for pain, before, during or after your surgery, you may be constipated. Take a laxative if necessary.   MEDICATION: You should resume your pre-surgery medications unless told not to. You may take oxybutynin or flomax if prescribed for bladder spasms or discomfort from the stent Take pain medication as directed for pain refractory to conservative management  PROBLEMS YOU SHOULD REPORT TO [place]:  Fevers over 100.5 Fahrenheit.  Heavy bleeding, or clots ( See above notes about blood in urine ).  Inability to urinate.  Drug reactions ( hives, rash, nausea, vomiting, diarrhea ).  Severe burning or pain with urination that is not improving.    General Anesthesia, Adult, Care After These instructions provide you with information about caring for yourself after your procedure. Your health care provider may also give you more specific instructions. Your treatment has been planned according to current medical practices, but problems sometimes occur. Call your health care provider if you have any problems or questions after your procedure. What can I expect after the procedure? After the procedure,  it is common to have:  Vomiting.  A sore throat.  Mental slowness.  It is common to feel:  Nauseous.  Cold or shivery.  Sleepy.  Tired.  Sore or achy, even in parts of your body where you did not have surgery.  Follow these instructions at home: For at least 24 hours after the procedure:  Do not: ? Participate in activities where you could fall or become injured. ? Drive. ? Use heavy machinery. ? Drink  alcohol. ? Take sleeping pills or medicines that cause drowsiness. ? Make important decisions or sign legal documents. ? Take care of children on your own.  Rest. Eating and drinking  If you vomit, drink water, juice, or soup when you can drink without vomiting.  Drink enough fluid to keep your urine clear or pale yellow.  Make sure you have little or no nausea before eating solid foods.  Follow the diet recommended by your health care provider. General instructions  Have a responsible adult stay with you until you are awake and alert.  Return to your normal activities as told by your health care provider. Ask your health care provider what activities are safe for you.  Take over-the-counter and prescription medicines only as told by your health care provider.  If you smoke, do not smoke without supervision.  Keep all follow-up visits as told by your health care provider. This is important. Contact a health care provider if:  You continue to have nausea or vomiting at home, and medicines are not helpful.  You cannot drink fluids or start eating again.  You cannot urinate after 8-12 hours.  You develop a skin rash.  You have fever.  You have increasing redness at the site of your procedure. Get help right away if:  You have difficulty breathing.  You have chest pain.  You have unexpected bleeding.  You feel that you are having a life-threatening or urgent problem. This information is not intended to replace advice given to you by your health care provider. Make sure you discuss any questions you have with your health care provider. Document Released: 03/07/2001 Document Revised: 05/03/2016 Document Reviewed: 11/13/2015 Elsevier Interactive Patient Education  2018 [employer].    Scopolamine skin patches What is this medicine? SCOPOLAMINE (skoe POL a meen) is used to prevent nausea and vomiting caused by motion sickness, anesthesia and surgery. This medicine  may be used for other purposes; ask your health care provider or pharmacist if you have questions. COMMON BRAND NAME(S): Transderm Scop What should I tell my health care provider before I take this medicine? They need to know if you have any of these conditions: -glaucoma -kidney or liver disease -an unusual or allergic reaction (especially skin allergy) to scopolamine, atropine, other medicines, foods, dyes, or preservatives -pregnant or trying to get pregnant -breast-feeding How should I use this medicine? This medicine is for external use only. Follow the directions on the prescription label. One patch contains enough medicine to prevent motion sickness for up to 3 days. Apply the patch at least 4 hours before you need it and only wear one disc at a time. Choose an area behind the ear, that is clean, dry, hairless and free from any cuts or irritation. Wipe the area with a clean dry tissue. Peel off the plastic backing of the skin patch, trying not to touch the adhesive side with your hands. Do not cut the patches. Firmly apply to the area you have chosen, with the metallic side  of the patch to the skin and the tan-colored side showing. Once firmly in place, wash your hands well with soap and water. Remove the disc after 3 days, or sooner if you no longer need it. After removing the patch, wash your hands and the area behind your ear thoroughly with soap and water. The patch will still contain some medicine after use. To avoid accidental contact or ingestion by children or pets, fold the used patch in half with the sticky side together and throw away in the trash out of the reach of children and pets. If you need to use a second patch after you remove the first, place it behind the other ear. Talk to your pediatrician regarding the use of this medicine in children. Special care may be needed. Overdosage: If you think you have taken too much of this medicine contact a poison control center or emergency  room at once. NOTE: This medicine is only for you. Do not share this medicine with others. What if I miss a dose? Make sure you apply the patch at least 4 hours before you need it. You can apply it the night before traveling. What may interact with this medicine? -benztropine -bethanechol -medicines for anxiety or sleeping problems like diazepam or temazepam -medicines for hay fever and other allergies -medicines for mental depression -muscle relaxants This list may not describe all possible interactions. Give your health care provider a list of all the medicines, herbs, non-prescription drugs, or dietary supplements you use. Also tell them if you smoke, drink alcohol, or use illegal drugs. Some items may interact with your medicine. What should I watch for while using this medicine? Keep the patch dry, if possible, to prevent it from falling off. Limited contact with water, however, as in bathing or swimming, will not affect the system. If the patch falls off, throw it away and put a new one behind the other ear. You may get drowsy or dizzy. Do not drive, use machinery, or do anything that needs mental alertness until you know how this medicine affects you. Do not stand or sit up quickly, especially if you are an older patient. This reduces the risk of dizzy or fainting spells. Alcohol may interfere with the effect of this medicine. Avoid alcoholic drinks. Your mouth may get dry. Chewing sugarless gum or sucking hard candy, and drinking plenty of water may help. Contact your doctor if the problem does not go away or is severe. This medicine may cause dry eyes and blurred vision. If you wear contact lenses you may feel some discomfort. Lubricating drops may help. See your eye doctor if the problem does not go away or is severe. If you are going to have a magnetic resonance imaging (MRI) procedure, tell your MRI technician if you have this patch on your body. It must be removed before a MRI. What  side effects may I notice from receiving this medicine? Side effects that you should report to your doctor or health care professional as soon as possible: -agitation, nervousness, confusion -blurred vision and other eye problems -dizziness, drowsiness -eye pain or redness in the whites of the eye -hallucinations -pain or difficulty passing urine -skin rash, itching -vomiting Side effects that usually do not require medical attention (report to your doctor or health care professional if they continue or are bothersome): -headache -nausea This list may not describe all possible side effects. Call your doctor for medical advice about side effects. You may report side effects to FDA  at 1-800-FDA-1088. Where should I keep my medicine? Keep out of the reach of children. Store at room temperature between 20 and 25 degrees C (68 and 77 degrees F). Throw away any unused medicine after the expiration date. When you remove a patch, fold it and throw it in the trash as described above. NOTE: This sheet is a summary. It may not cover all possible information. If you have questions about this medicine, talk to your doctor, pharmacist, or health care provider.  2018 Elsevier/Gold Standard (2012-04-27 13:31:48)

## 2017-10-05 ENCOUNTER — Encounter (HOSPITAL_COMMUNITY): Payer: Self-pay | Admitting: Urology

## 2017-10-05 ENCOUNTER — Other Ambulatory Visit: Payer: Self-pay | Admitting: Endocrinology

## 2017-10-05 DIAGNOSIS — R928 Other abnormal and inconclusive findings on diagnostic imaging of breast: Secondary | ICD-10-CM

## 2017-10-10 NOTE — Anesthesia Postprocedure Evaluation (Signed)
Anesthesia Post Note  Patient: Brandy Oconnell  Procedure(s) Performed: CYSTOSCOPY WITH RETROGRADE PYELOGRAM, URETEROSCOPY AND STENT PLACEMENT (Left )     Patient location during evaluation: PACU Anesthesia Type: General Level of consciousness: awake and alert Pain management: pain level controlled Vital Signs Assessment: post-procedure vital signs reviewed and stable Respiratory status: spontaneous breathing, nonlabored ventilation, respiratory function stable and patient connected to nasal cannula oxygen Cardiovascular status: blood pressure returned to baseline and stable Postop Assessment: no apparent nausea or vomiting Anesthetic complications: no    Last Vitals:  Vitals:   10/04/17 1755 10/04/17 1843  BP: (!) 146/64 122/81  Pulse: 80 72  Resp: 18 18  Temp: 37.3 C 37.1 C  SpO2: 92% 94%    Last Pain:  Vitals:   10/04/17 1843  TempSrc: Oral  PainSc: 0-No pain                 [name]

## 2017-10-13 ENCOUNTER — Ambulatory Visit
Admission: RE | Admit: 2017-10-13 | Discharge: 2017-10-13 | Disposition: A | Discharge status: Home / Self Care | Payer: Medicare Other | Source: Ambulatory Visit | Attending: Endocrinology | Admitting: Endocrinology

## 2017-10-13 ENCOUNTER — Other Ambulatory Visit: Payer: Self-pay | Admitting: Endocrinology

## 2017-10-13 DIAGNOSIS — R928 Other abnormal and inconclusive findings on diagnostic imaging of breast: Secondary | ICD-10-CM

## 2017-10-13 DIAGNOSIS — N6320 Unspecified lump in the left breast, unspecified quadrant: Secondary | ICD-10-CM

## 2017-10-17 ENCOUNTER — Ambulatory Visit
Admission: RE | Admit: 2017-10-17 | Discharge: 2017-10-17 | Disposition: A | Discharge status: Home / Self Care | Payer: Medicare Other | Source: Ambulatory Visit | Attending: Endocrinology | Admitting: Endocrinology

## 2017-10-17 ENCOUNTER — Other Ambulatory Visit: Payer: Self-pay | Admitting: Endocrinology

## 2017-10-17 DIAGNOSIS — N6320 Unspecified lump in the left breast, unspecified quadrant: Secondary | ICD-10-CM

## 2017-12-14 ENCOUNTER — Other Ambulatory Visit: Payer: Self-pay | Admitting: Cardiovascular Disease

## 2017-12-14 NOTE — Telephone Encounter (Signed)
Rx has been sent to the pharmacy electronically. ° °

## 2018-02-17 ENCOUNTER — Other Ambulatory Visit: Payer: Self-pay | Admitting: Cardiovascular Disease

## 2018-02-17 NOTE — Telephone Encounter (Signed)
REFILL 

## 2018-03-02 ENCOUNTER — Ambulatory Visit: Payer: Medicare Other | Admitting: Cardiovascular Disease

## 2018-03-02 ENCOUNTER — Encounter: Payer: Self-pay | Admitting: Cardiovascular Disease

## 2018-03-02 VITALS — BP 128/62 | HR 71 | Ht 62.5 in | Wt 210.0 lb

## 2018-03-02 DIAGNOSIS — R011 Cardiac murmur, unspecified: Secondary | ICD-10-CM

## 2018-03-02 DIAGNOSIS — E7800 Pure hypercholesterolemia, unspecified: Secondary | ICD-10-CM | POA: Diagnosis not present

## 2018-03-02 DIAGNOSIS — I10 Essential (primary) hypertension: Secondary | ICD-10-CM | POA: Diagnosis not present

## 2018-03-02 NOTE — Progress Notes (Signed)
Patient ID: Brandy Oconnell, female   DOB: 01/17/1941, 77 y.o.   MRN: [id number]    Cardiology Office Note    Date:  03/02/2018   ID:  Brandy Oconnell, DOB 12/04/1941, MRN [id number]  PCP:  Adrian PrinceSouth, Stephen, MD  Cardiologist:   Thurmon FairMihai Mystic Labo, MD   Chief Complaint  Patient presents with  . Hypertension  . Hyperlipidemia    History of Present Illness:  Brandy Oconnell is a 77 y.o. female with systemic hypertension and hyperlipidemia returns for follow-up.   She has had a quiet year from a cardiovascular point of view.  She had to undergo cystoscopy for removal of a kidney stone and had a breast biopsy with a benign report.  She has managed to lose some weight and is no longer morbidly obese, although her BMI still remains over 37.  She did not tolerate the irbesartan which caused extreme fatigue, but is on a non-recalled lot of valsartan which works well for her.  The patient specifically denies any chest pain at rest or with exertion, dyspnea at rest or with exertion, orthopnea, paroxysmal nocturnal dyspnea, syncope, palpitations, focal neurological deficits, intermittent claudication, lower extremity edema, unexplained weight gain, cough, hemoptysis or wheezing.  She is intolerant to daily statin administration and did not tolerate Zetia. She takes rosuvastatin twice weekly. Last labs showed total cholesterol 190, triglycerides 203, HDL 41, LDL 108 glucose 107, hemoglobin A1c 5.6%. Thyroid, liver and renal tests were normal (creat 0.8).  Has had difficulty identifying an effective treatment for her blood pressure due to side effects with numerous medications. She developed massive uncomfortable leg edema with calcium channel blockers (both amlodipine and verapamil) and did not tolerate beta blockers. She has asthma. She developed a cough with ACE inhibitors. She has no side effects with valsartan, but was intolerant of irbesartan. She has a history of calcium oxalate kidney stones. She also has  hyperlipidemia but has been intolerant to multiple statins including daily rosuvastatin, pravastatin and Vytorin. Also did not tolerate Zetia. She seems to tolerate twice weekly rosuvastatin without complaints, but her lipid profile is not quite at target.  She does not have known coronary or peripheral arterial obstructive disease.  Past Medical History:  Diagnosis Date  . ALLERGIC RHINITIS   . Asthma   . Bronchiectasis   . Complication of anesthesia    BP bottomed out and they had to put IV back in me.  . Degenerative arthritis   . Depression   . Essential and other specified forms of tremor 09/13/2013  . Fibromyalgia   . Hyperlipidemia   . Hypertension   . Hypothyroidism   . Kidney stone   . Migraine   . Migraine without aura, without mention of intractable migraine without mention of status migrainosus 09/13/2013  . Obesity   . Palpitation   . Pelvic fracture (HCC)   . PONV (postoperative nausea and vomiting)     Past Surgical History:  Procedure Laterality Date  . CYSTOSCOPY WITH RETROGRADE PYELOGRAM, URETEROSCOPY AND STENT PLACEMENT Left 10/04/2017   Procedure: CYSTOSCOPY WITH RETROGRADE PYELOGRAM, URETEROSCOPY AND STENT PLACEMENT;  Surgeon: Crista ElliotBell, Eugene D III, MD;  Location: [facility];  Service: Urology;  Laterality: Left;  . gyn laparoscopy    . LAPAROSCOPIC HYSTERECTOMY    . NASAL SEPTUM SURGERY    . VESICOVAGINAL FISTULA CLOSURE W/ TAH  1984    Outpatient Medications Prior to Visit  Medication Sig Dispense Refill  . aspirin 81 MG tablet Take 81 mg by  mouth daily.    . famotidine (PEPCID) 10 MG tablet Take 10 mg by mouth as needed.     . furosemide (LASIX) 40 MG tablet Take 20-40 mg by mouth daily.     Marland Kitchen levothyroxine (SYNTHROID, LEVOTHROID) 112 MCG tablet Take 112 mcg by mouth daily. Takes  One full tablet five days a week then takes half a tablet on Saturdays.    Marland Kitchen loratadine (CLARITIN) 10 MG tablet Take 10 mg by mouth daily as needed for allergies.     Marland Kitchen LORazepam  (ATIVAN) 1 MG tablet Take 1 tablet (1 mg total) by mouth 2 (two) times daily as needed for anxiety. (Patient taking differently: Take 1 mg by mouth at bedtime. ) 30 tablet 5  . rosuvastatin (CRESTOR) 10 MG tablet TAKE 1 TABLET BY MOUTH  TWICE WEEKLY AS DIRECTED 26 tablet 2  . valsartan (DIOVAN) 320 MG tablet Take 1 tablet (320 mg total) by mouth daily. KEEP OV. 90 tablet 0  . Vitamin D, Cholecalciferol, 1000 UNITS TABS Take 5,000 Units by mouth every evening.     Marland Kitchen FLUZONE HIGH-DOSE 0.5 ML injection ADM 0.5ML IM UTD  0  . amoxicillin (AMOXIL) 500 MG capsule Take 1 capsule (500 mg total) by mouth 2 (two) times daily. (Patient not taking: Reported on 09/13/2017) 14 capsule 0  . benzonatate (TESSALON) 100 MG capsule Take 1 capsule (100 mg total) by mouth every 6 (six) hours as needed for cough. (Patient not taking: Reported on 09/13/2017) 30 capsule 1  . HYDROcodone-acetaminophen (NORCO) 5-325 MG tablet Take 1-2 tablets by mouth every 4 (four) hours as needed. (Patient not taking: Reported on 10/04/2017) 6 tablet 0  . ipratropium (ATROVENT) 0.06 % nasal spray 1-2 puffs each nostril up to 4 times daily as needed (Patient not taking: Reported on 09/13/2017) 15 mL 12  . irbesartan (AVAPRO) 300 MG tablet Take 300 mg by mouth daily.    . ondansetron (ZOFRAN) 4 MG tablet Take 1 tablet (4 mg total) by mouth every 8 (eight) hours as needed for nausea or vomiting. 10 tablet 0   No facility-administered medications prior to visit.      Allergies:   Erythromycin; Latex; Ace inhibitors; Beta adrenergic blockers; Codeine; Doxycycline; Tape; Amlodipine; Pneumovax 23 [pneumococcal vac polyvalent]; Sulfonamide derivatives; and Tramadol   Social History   Socioeconomic History  . Marital status: Divorced    Spouse name: Not on file  . Number of children: 0  . Years of education: COLLEGE  . Highest education level: Not on file  Occupational History  . Occupation: full time clinical lab tech  Social Needs  .  Financial resource strain: Not on file  . Food insecurity:    Worry: Not on file    Inability: Not on file  . Transportation needs:    Medical: Not on file    Non-medical: Not on file  Tobacco Use  . Smoking status: Never Smoker  . Smokeless tobacco: Never Used  Substance and Sexual Activity  . Alcohol use: No  . Drug use: No  . Sexual activity: Not on file  Lifestyle  . Physical activity:    Days per week: Not on file    Minutes per session: Not on file  . Stress: Not on file  Relationships  . Social connections:    Talks on phone: Not on file    Gets together: Not on file    Attends religious service: Not on file    Active member of club or  organization: Not on file    Attends meetings of clubs or organizations: Not on file    Relationship status: Not on file  Other Topics Concern  . Not on file  Social History Narrative   Patient does not drink caffeine.   Patient is right handed.     Family History:  The patient's family history includes Asthma in her mother; Bladder Cancer in her father; COPD in her father; Cancer in her mother; Diabetes in her maternal grandmother, mother, and sister; Headache in her mother; Heart attack in her brother and maternal aunt; Heart disease in her brother, maternal grandmother, mother, and sister; Hypertension in her sister; Kidney cancer in her mother and paternal grandfather; Stroke in her father and mother; Tremor in her maternal grandmother, mother, and sister.   ROS:   Please see the history of present illness.    ROS All other systems reviewed and are negative.   PHYSICAL EXAM:   VS:  BP 128/62   Pulse 71   Ht 5' 2.5" (1.588 m)   Wt 210 lb (95.3 kg)   BMI 37.80 kg/m    GEN: Well nourished, well developed, in no acute distress   General: Alert, oriented x3, no distress, obese Head: no evidence of trauma, PERRL, EOMI, no exophtalmos or lid lag, no myxedema, no xanthelasma; normal ears, nose and oropharynx Neck: normal jugular  venous pulsations and no hepatojugular reflux; brisk carotid pulses without delay and no carotid bruits Chest: clear to auscultation, no signs of consolidation by percussion or palpation, normal fremitus, symmetrical and full respiratory excursions Cardiovascular: normal position and quality of the apical impulse, regular rhythm, normal first and second heart sounds, very faint 1/6 aortic ejection murmur, no diastolic murmurs, rubs or gallops Abdomen: no tenderness or distention, no masses by palpation, no abnormal pulsatility or arterial bruits, normal bowel sounds, no hepatosplenomegaly Extremities: no clubbing, cyanosis or edema; 2+ radial, ulnar and brachial pulses bilaterally; 2+ right femoral, posterior tibial and dorsalis pedis pulses; 2+ left femoral, posterior tibial and dorsalis pedis pulses; no subclavian or femoral bruits Neurological: grossly nonfocal Psych: Normal mood and affect   Wt Readings from Last 3 Encounters:  03/02/18 210 lb (95.3 kg)  10/04/17 220 lb (99.8 kg)  09/13/17 216 lb (98 kg)      Studies/Labs Reviewed:   EKG:  EKG is ordered today.  The ekg ordered today demonstrates NSRLow voltage QRS and poor progression all suggestive of obesity, QTC 440 ms. There are no ischemic appearing repolarization abnormalities  Recent Labs: total cholesterol 190, triglycerides 203, HDL 41, LDL 108, glucose 107, hemoglobin A1c 5.7%. Thyroid, liver and renal tests were normal (creat 0.8)    ASSESSMENT:    1. Essential hypertension   2. Pure hypercholesterolemia   3. Severe obesity (HCC)   4. Systolic murmur      PLAN:  In order of problems listed above:  1. HTN: Well controlled with valsartan.  Note numerous other drugs were poorly tolerated. 2. HLP: Continue twice weekly Crestor, did not tolerate more aggressive lipid-lowering. 3. Obesity: Congratulated on weight loss since last year.  Needs to keep up the good work. 4. Murmur: Sounds like it is probably aortic valve  sclerosis, without stenosis.  Plan on echo only if she becomes symptomatic.    Medication Adjustments/Labs and Tests Ordered: Current medicines are reviewed at length with the patient today.  Concerns regarding medicines are outlined above.  Medication changes, Labs and Tests ordered today are listed in the Patient  Instructions below. Patient Instructions  Dr Royann Shivers recommends that you schedule a follow-up appointment in 12 months. You will receive a reminder letter in the mail two months in advance. If you don't receive a letter, please call our office to schedule the follow-up appointment.  If you need a refill on your cardiac medications before your next appointment, please call your pharmacy.      Signed, Thurmon Fair, MD  03/02/2018 1:54 PM    [facility] [address] [place], [place], [place]  [address] Phone: [phone]; Fax: [phone]

## 2018-03-02 NOTE — Patient Instructions (Signed)
Dr Croitoru recommends that you schedule a follow-up appointment in 12 months. You will receive a reminder letter in the mail two months in advance. If you don't receive a letter, please call our office to schedule the follow-up appointment.  If you need a refill on your cardiac medications before your next appointment, please call your pharmacy. 

## 2018-05-27 ENCOUNTER — Other Ambulatory Visit: Payer: Self-pay | Admitting: Cardiovascular Disease

## 2018-09-11 ENCOUNTER — Other Ambulatory Visit: Payer: Self-pay | Admitting: Endocrinology

## 2018-09-11 DIAGNOSIS — Z1231 Encounter for screening mammogram for malignant neoplasm of breast: Secondary | ICD-10-CM

## 2018-10-18 ENCOUNTER — Ambulatory Visit: Payer: Medicare Other

## 2018-10-30 ENCOUNTER — Ambulatory Visit
Admission: RE | Admit: 2018-10-30 | Discharge: 2018-10-30 | Disposition: A | Discharge status: Home / Self Care | Payer: Medicare Other | Source: Ambulatory Visit | Attending: Endocrinology | Admitting: Endocrinology

## 2018-10-30 DIAGNOSIS — Z1231 Encounter for screening mammogram for malignant neoplasm of breast: Secondary | ICD-10-CM

## 2019-03-12 ENCOUNTER — Other Ambulatory Visit: Payer: Self-pay | Admitting: Cardiovascular Disease

## 2019-03-20 ENCOUNTER — Ambulatory Visit: Payer: Medicare Other | Admitting: Cardiovascular Disease

## 2019-04-04 ENCOUNTER — Other Ambulatory Visit: Payer: Self-pay

## 2019-04-04 MED ORDER — VALSARTAN 320 MG PO TABS: 320.0000 mg | ORAL_TABLET | Freq: Every day | ORAL | 2 refills | Status: DC

## 2019-04-05 ENCOUNTER — Other Ambulatory Visit: Payer: Self-pay

## 2019-04-05 MED ORDER — VALSARTAN 320 MG PO TABS: 320.0000 mg | ORAL_TABLET | Freq: Every day | ORAL | 2 refills | Status: DC

## 2019-04-16 ENCOUNTER — Telehealth: Payer: Self-pay | Admitting: Cardiovascular Disease

## 2019-04-16 MED ORDER — IRBESARTAN 300 MG PO TABS: 300.0000 mg | ORAL_TABLET | Freq: Every day | ORAL | 1 refills | Status: DC

## 2019-04-16 NOTE — Telephone Encounter (Signed)
Pt to pick up ./cy

## 2019-04-16 NOTE — Telephone Encounter (Signed)
Patient states both [facility] and [facility] VALSARTAN  IN IS ON BACKORDER - DO NOT KNOW WHEN IT WILL BE IN STOCK.  THEY DO HAVE LOSARTAN OR IRBESARTAN AVAILABLE.   PATIENT STATES SHE HAS LOSARTAN IN THE PAST  BEFORE SWITCHING TO VALSARTAN. SHE CAN NOT USE LISINOPRIL - DUE TO COUGH .  Patient aware will de fr to Dr Royann Shivers and pharmacist- [facility]-   Patient would like rx sent to [facility] with 90 day supply once the substitute is known

## 2019-04-16 NOTE — Telephone Encounter (Signed)
  Pt c/o medication issue:  1. Name of Medication: valsartan (DIOVAN) 320 MG tablet  2. How are you currently taking this medication (dosage and times per day)? As directed  3. Are you having a reaction (difficulty breathing--STAT)? NA  4. What is your medication issue? Pharmacy states medication is on backorder and they do not know when they will be able to get it. Patient wants to know if she can take something else but she also has problems with some of the  Medications. Please call

## 2019-04-16 NOTE — Telephone Encounter (Signed)
Rx for Irbesartan 300mg  sent to prefder pharmacy.  Therapy changed discussed with patient.

## 2019-04-16 NOTE — Telephone Encounter (Signed)
Thanks

## 2019-06-27 ENCOUNTER — Telehealth: Payer: Self-pay | Admitting: Internal Medicine

## 2019-06-27 MED ORDER — AMOXICILLIN 500 MG PO TABS: 500.0000 mg | ORAL_TABLET | Freq: Two times a day (BID) | ORAL | 1 refills | Status: DC

## 2019-06-27 NOTE — Telephone Encounter (Signed)
Asks amoxacillin for ear infection Sent to [facility]

## 2019-07-24 ENCOUNTER — Other Ambulatory Visit: Payer: Self-pay

## 2019-07-24 ENCOUNTER — Encounter: Payer: Self-pay | Admitting: Cardiovascular Disease

## 2019-07-24 ENCOUNTER — Telehealth: Payer: Self-pay | Admitting: Cardiovascular Disease

## 2019-07-24 ENCOUNTER — Ambulatory Visit (INDEPENDENT_AMBULATORY_CARE_PROVIDER_SITE_OTHER): Payer: Medicare Other | Admitting: Cardiovascular Disease

## 2019-07-24 VITALS — BP 150/82 | HR 70 | Ht 62.5 in | Wt 220.6 lb

## 2019-07-24 DIAGNOSIS — I10 Essential (primary) hypertension: Secondary | ICD-10-CM | POA: Diagnosis not present

## 2019-07-24 DIAGNOSIS — E66812 Obesity, class 2: Secondary | ICD-10-CM

## 2019-07-24 DIAGNOSIS — E7800 Pure hypercholesterolemia, unspecified: Secondary | ICD-10-CM | POA: Diagnosis not present

## 2019-07-24 DIAGNOSIS — R011 Cardiac murmur, unspecified: Secondary | ICD-10-CM | POA: Diagnosis not present

## 2019-07-24 MED ORDER — OLMESARTAN MEDOXOMIL 40 MG PO TABS: 40.0000 mg | ORAL_TABLET | Freq: Every day | ORAL | 11 refills | Status: DC

## 2019-07-24 MED ORDER — OLMESARTAN MEDOXOMIL 40 MG PO TABS: 40.0000 mg | ORAL_TABLET | Freq: Every day | ORAL | 3 refills | Status: DC

## 2019-07-24 NOTE — Telephone Encounter (Signed)
Returned call to patient she stated she read side effects on Olmesartan and she was afraid to take.Stated she took first dose at 2:00 pm today.Advised to take as directed and call back if B/P continues to be elevated.

## 2019-07-24 NOTE — Telephone Encounter (Signed)
Patient is calling about BP medication Dr Sallyanne Kuster prescribed to her today, olmesartan (BENICAR) 40 MG tablet. She has some questions about it.

## 2019-07-24 NOTE — Patient Instructions (Addendum)
Medication Instructions:  STOP the Irbesartan START Olmesartan 40 mg once daily If you need a refill on your cardiac medications before your next appointment, please call your pharmacy.   Lab work: None ordered If you have labs (blood work) drawn today and your tests are completely normal, you will receive your results only by: Marland Kitchen MyChart Message (if you have MyChart) OR . A paper copy in the mail If you have any lab test that is abnormal or we need to change your treatment, we will call you to review the results.  Testing/Procedures: None ordered  Follow-Up: At [facility], you and your health needs are our priority.  As part of our continuing mission to provide you with exceptional heart care, we have created designated Provider Care Teams.  These Care Teams include your primary Cardiologist (physician) and Advanced Practice Providers (APPs -  Physician Assistants and Nurse Practitioners) who all work together to provide you with the care you need, when you need it. You will need a follow up appointment in 12 months.  Please call our office 2 months in advance to schedule this appointment.  You may see Sanda Klein, MD or one of the following Advanced Practice Providers on your designated Care Team: [place], [place] . Fabian Sharp, PA-C

## 2019-07-24 NOTE — Progress Notes (Signed)
Patient ID: Brandy Oconnell, female   DOB: 02/05/1941, 78 y.o.   MRN: [id number]    Cardiology Office Note    Date:  07/24/2019   ID:  Brandy Oconnell, DOB 11/26/1941, MRN [id number]  PCP:  Adrian PrinceSouth, Stephen, MD  Cardiologist:   Thurmon FairMihai Korynn Kenedy, MD   Chief Complaint  Patient presents with  . Hypertension  . Hyperlipidemia    History of Present Illness:  Brandy HeadsLinda W Oconnell is a 78 y.o. female with systemic hypertension and hyperlipidemia returns for follow-up.   She denies any cardiovascular complaints but is frustrated due to the recurrent problems with the supply of ARB.  Irbesartan does not work as well for her as valsartan did.  Her blood pressure remains elevated.  The irbesartan also causes nausea.  She is frustrated by the difficulty accessing medical professionals during the coronavirus pandemic and is upset that she has gained weight due to reduced physical activity.  BMI is almost back up to 40.  The patient specifically denies any chest pain at rest or with exertion, dyspnea at rest or with exertion, orthopnea, paroxysmal nocturnal dyspnea, syncope, palpitations, focal neurological deficits, intermittent claudication, lower extremity edema, unexplained weight gain, cough, hemoptysis or wheezing.  Excellent recent lipid profile.  Has had difficulty identifying an effective treatment for her blood pressure due to side effects with numerous medications. She developed massive uncomfortable leg edema with calcium channel blockers (both amlodipine and verapamil) and did not tolerate beta blockers. She has asthma. She developed a cough with ACE inhibitors. She has no side effects with valsartan, but was intolerant of irbesartan. She has a history of calcium oxalate kidney stones. She also has hyperlipidemia but has been intolerant to multiple statins including daily rosuvastatin, pravastatin and Vytorin. Also did not tolerate Zetia. She seems to tolerate twice weekly rosuvastatin without complaints,  but her lipid profile is not quite at target.  She does not have known coronary or peripheral arterial obstructive disease.  Past Medical History:  Diagnosis Date  . ALLERGIC RHINITIS   . Asthma   . Bronchiectasis   . Complication of anesthesia    BP bottomed out and they had to put IV back in me.  . Degenerative arthritis   . Depression   . Essential and other specified forms of tremor 09/13/2013  . Fibromyalgia   . Hyperlipidemia   . Hypertension   . Hypothyroidism   . Kidney stone   . Migraine   . Migraine without aura, without mention of intractable migraine without mention of status migrainosus 09/13/2013  . Obesity   . Palpitation   . Pelvic fracture (HCC)   . PONV (postoperative nausea and vomiting)     Past Surgical History:  Procedure Laterality Date  . CYSTOSCOPY WITH RETROGRADE PYELOGRAM, URETEROSCOPY AND STENT PLACEMENT Left 10/04/2017   Procedure: CYSTOSCOPY WITH RETROGRADE PYELOGRAM, URETEROSCOPY AND STENT PLACEMENT;  Surgeon: Crista ElliotBell, Brandy D III, MD;  Location: [facility];  Service: Urology;  Laterality: Left;  . gyn laparoscopy    . LAPAROSCOPIC HYSTERECTOMY    . NASAL SEPTUM SURGERY    . VESICOVAGINAL FISTULA CLOSURE W/ TAH  1984    Outpatient Medications Prior to Visit  Medication Sig Dispense Refill  . amoxicillin (AMOXIL) 500 MG tablet Take 1 tablet (500 mg total) by mouth 2 (two) times daily. 14 tablet 1  . aspirin 81 MG tablet Take 81 mg by mouth daily.    . famotidine (PEPCID) 10 MG tablet Take 10 mg by mouth as needed.     .Marland Kitchen  furosemide (LASIX) 40 MG tablet Take 20-40 mg by mouth daily.     Marland Kitchen. levothyroxine (SYNTHROID, LEVOTHROID) 112 MCG tablet Take 112 mcg by mouth daily. Takes  One full tablet five days a week then takes half a tablet on Saturdays.    Marland Kitchen. loratadine (CLARITIN) 10 MG tablet Take 10 mg by mouth daily as needed for allergies.     Marland Kitchen. LORazepam (ATIVAN) 1 MG tablet Take 1 tablet (1 mg total) by mouth 2 (two) times daily as needed for anxiety.  (Patient taking differently: Take 1 mg by mouth at bedtime. ) 30 tablet 5  . rosuvastatin (CRESTOR) 10 MG tablet TAKE 1 TABLET BY MOUTH  TWICE WEEKLY AS DIRECTED 26 tablet 2  . Vitamin D, Cholecalciferol, 1000 UNITS TABS Take 5,000 Units by mouth every evening.     . irbesartan (AVAPRO) 300 MG tablet Take 1 tablet (300 mg total) by mouth daily. 90 tablet 1   No facility-administered medications prior to visit.      Allergies:   Erythromycin, Latex, Ace inhibitors, Amoxicillin, Beta adrenergic blockers, Codeine, Doxycycline, Tape, Amlodipine, Pneumovax 23 [pneumococcal vac polyvalent], Sulfonamide derivatives, and Tramadol   Social History   Socioeconomic History  . Marital status: Divorced    Spouse name: Not on file  . Number of children: 0  . Years of education: COLLEGE  . Highest education level: Not on file  Occupational History  . Occupation: full time clinical lab tech  Social Needs  . Financial resource strain: Not on file  . Food insecurity    Worry: Not on file    Inability: Not on file  . Transportation needs    Medical: Not on file    Non-medical: Not on file  Tobacco Use  . Smoking status: Never Smoker  . Smokeless tobacco: Never Used  Substance and Sexual Activity  . Alcohol use: No  . Drug use: No  . Sexual activity: Not on file  Lifestyle  . Physical activity    Days per week: Not on file    Minutes per session: Not on file  . Stress: Not on file  Relationships  . Social Musicianconnections    Talks on phone: Not on file    Gets together: Not on file    Attends religious service: Not on file    Active member of club or organization: Not on file    Attends meetings of clubs or organizations: Not on file    Relationship status: Not on file  Other Topics Concern  . Not on file  Social History Narrative   Patient does not drink caffeine.   Patient is right handed.     Family History:  The patient's family history includes Asthma in her mother; Bladder Cancer  in her father; COPD in her father; Cancer in her mother; Diabetes in her maternal grandmother, mother, and sister; Headache in her mother; Heart attack in her brother and maternal aunt; Heart disease in her brother, maternal grandmother, mother, and sister; Hypertension in her sister; Kidney cancer in her mother and paternal grandfather; Stroke in her father and mother; Tremor in her maternal grandmother, mother, and sister.   ROS:   Please see the history of present illness.    ROS All other systems are reviewed and are negative  PHYSICAL EXAM:   VS:  BP (!) 150/82   Pulse 70   Ht 5' 2.5" (1.588 m)   Wt 220 lb 9.6 oz (100.1 kg)   SpO2 95%  BMI 39.71 kg/m     General: Alert, oriented x3, no distress, severely obese Head: no evidence of trauma, PERRL, EOMI, no exophtalmos or lid lag, no myxedema, no xanthelasma; normal ears, nose and oropharynx Neck: normal jugular venous pulsations and no hepatojugular reflux; brisk carotid pulses without delay and no carotid bruits Chest: clear to auscultation, no signs of consolidation by percussion or palpation, normal fremitus, symmetrical and full respiratory excursions Cardiovascular: normal position and quality of the apical impulse, regular rhythm, normal first and second heart sounds, barely audible aortic ejection murmur no diastolic murmurs, rubs or gallops Abdomen: no tenderness or distention, no masses by palpation, no abnormal pulsatility or arterial bruits, normal bowel sounds, no hepatosplenomegaly Extremities: no clubbing, cyanosis or edema; 2+ radial, ulnar and brachial pulses bilaterally; 2+ right femoral, posterior tibial and dorsalis pedis pulses; 2+ left femoral, posterior tibial and dorsalis pedis pulses; no subclavian or femoral bruits Neurological: grossly nonfocal Psych: Normal mood and affect    Wt Readings from Last 3 Encounters:  07/24/19 220 lb 9.6 oz (100.1 kg)  03/02/18 210 lb (95.3 kg)  10/04/17 220 lb (99.8 kg)       Studies/Labs Reviewed:   EKG:  EKG is ordered today.  It shows normal sinus rhythm, low voltage and delayed R wave progression related to obesity, QTC 425 ms, normal repolarization pattern.   Recent Labs: June 04, 2019 Total cholesterol 178, triglycerides 140, HDL 50, LDL 100 Creatinine 0.8, potassium 4.4, normal liver function tests, hemoglobin 13.7, TSH 0.51, hemoglobin A1c 5.7%   ASSESSMENT:    1. Essential hypertension   2. Hypercholesterolemia   3. Class 2 severe obesity with body mass index (BMI) of 35 to 39.9 with serious comorbidity (HCC)   4. Systolic murmur      PLAN:  In order of problems listed above:  1. HTN: Well controlled with valsartan, but availability is always challenging; we will [facility] to switch [facility] 40 mg once daily since there have been no shortages of this drug.Marland Kitchen.  Note numerous other drugs were poorly tolerated. 2. HLP: Excellent lipid profile on the current regimen taking Crestor only twice weekly. 3. Obesity: Unfortunately has gained back weight and is almost back in the morbidly obese range. 4. 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  Siderotic but will defer during the coronavirus pandemic unless she becomes symptomatic.    Medication Adjustments/Labs and Tests Ordered: Current medicines are reviewed at length with the patient today.  Concerns regarding medicines are outlined above.  Medication changes, Labs and Tests ordered today are listed in the Patient Instructions below. Patient Instructions  Medication Instructions:  STOP the Irbesartan START Olmesartan 40 mg once daily If you need a refill on your cardiac medications before your next appointment, please call your pharmacy.   Lab work: None ordered If you have labs (blood work) drawn today and your tests are completely normal, you will receive your results only by: Marland Kitchen. MyChart Message (if you have MyChart) OR . A paper copy in the mail If you have any lab test  that is abnormal or we need to change your treatment, we will call you to review the results.  Testing/Procedures: None ordered  Follow-Up: At [facility], you and your health needs are our priority.  As part of our continuing mission to provide you with exceptional heart care, we have created designated Provider Care Teams.  These Care Teams include your primary Cardiologist (physician) and Advanced Practice Providers (APPs -  Physician Assistants and Nurse Practitioners) who all work together to provide you with the care you need, when you need it. You will need a follow up appointment in 12 months.  Please call our office 2 months in advance to schedule this appointment.  You may see Sanda Klein, MD or one of the following Advanced Practice Providers on your designated Care Team: [place], [place] . Fabian Sharp, PA-C         Signed, Sanda Klein, MD  07/24/2019 1:17 PM    [facility] [address], [place], [place]  [address] Phone: [phone]; Fax: [phone]

## 2019-09-24 ENCOUNTER — Other Ambulatory Visit: Payer: Self-pay | Admitting: Endocrinology

## 2019-09-24 DIAGNOSIS — Z1231 Encounter for screening mammogram for malignant neoplasm of breast: Secondary | ICD-10-CM

## 2019-11-12 ENCOUNTER — Other Ambulatory Visit: Payer: Self-pay

## 2019-11-12 ENCOUNTER — Ambulatory Visit
Admission: RE | Admit: 2019-11-12 | Discharge: 2019-11-12 | Disposition: A | Discharge status: Home / Self Care | Payer: Medicare Other | Source: Ambulatory Visit | Attending: Endocrinology | Admitting: Endocrinology

## 2019-11-12 DIAGNOSIS — Z1231 Encounter for screening mammogram for malignant neoplasm of breast: Secondary | ICD-10-CM

## 2020-01-11 ENCOUNTER — Ambulatory Visit: Payer: Medicare Other

## 2020-01-19 ENCOUNTER — Ambulatory Visit: Payer: Medicare Other | Attending: Internal Medicine

## 2020-01-19 DIAGNOSIS — Z23 Encounter for immunization: Secondary | ICD-10-CM | POA: Insufficient documentation

## 2020-01-19 NOTE — Progress Notes (Signed)
   Covid-19 Vaccination Clinic  Name:  Brandy Oconnell    MRN: [id number] DOB: 07-May-1941  01/19/2020  Brandy Oconnell was observed post Covid-19 immunization for 15 minutes without incidence. She was provided with Vaccine Information Sheet and instruction to access the V-Safe system.   Brandy Oconnell was instructed to call 911 with any severe reactions post vaccine: Marland Kitchen Difficulty breathing  . Swelling of your face and throat  . A fast heartbeat  . A bad rash all over your body  . Dizziness and weakness    Immunizations Administered    Name Date Dose VIS Date Route   Pfizer COVID-19 Vaccine 01/19/2020  3:53 PM 0.3 mL 11/23/2019 Intramuscular   Manufacturer: [employer], [employer]   Lot: [id number]   NDC: [address]-8

## 2020-01-28 ENCOUNTER — Telehealth: Payer: Self-pay | Admitting: Cardiovascular Disease

## 2020-01-28 MED ORDER — VALSARTAN 320 MG PO TABS: 320.0000 mg | ORAL_TABLET | Freq: Every day | ORAL | 3 refills | Status: DC

## 2020-01-28 NOTE — Telephone Encounter (Signed)
Spoke with patient. She reports since taking the Olmesartan in September she has been having stomach pain, diarrhea, arm and leg aching and weakness. She has stopped the Olmesartan and has restarted her 320mg  of Valsartan. She would like Dr. to refill the Valsartan prescription at [facility].   Will route to MD and RN for review.

## 2020-01-28 NOTE — Telephone Encounter (Signed)
OK, will do that

## 2020-01-28 NOTE — Telephone Encounter (Signed)
New message   Per patient has quit taking olmesartan medoxomil 40 mg taking due to side effects. Patient states that she has started back on 320 mg of valsartan. Please call to discuss.

## 2020-01-28 NOTE — Telephone Encounter (Signed)
Valsartan 320 mg has been sent in for her.

## 2020-02-01 ENCOUNTER — Ambulatory Visit: Payer: Medicare Other

## 2020-02-06 ENCOUNTER — Telehealth: Payer: Self-pay | Admitting: *Deleted

## 2020-02-06 NOTE — Telephone Encounter (Signed)
Patient calling to confirm her vaccine appointment. Done.

## 2020-02-11 ENCOUNTER — Other Ambulatory Visit: Payer: Self-pay | Admitting: Cardiovascular Disease

## 2020-02-12 ENCOUNTER — Ambulatory Visit: Payer: Self-pay

## 2020-02-13 ENCOUNTER — Ambulatory Visit: Payer: Medicare Other | Attending: Internal Medicine

## 2020-02-13 DIAGNOSIS — Z23 Encounter for immunization: Secondary | ICD-10-CM | POA: Insufficient documentation

## 2020-02-13 NOTE — Progress Notes (Signed)
   Covid-19 Vaccination Clinic  Name:  Brandy Oconnell    MRN: [id number] DOB: Apr 19, 1941  02/13/2020  Brandy Oconnell was observed post Covid-19 immunization for 15 minutes without incident. She was provided with Vaccine Information Sheet and instruction to access the V-Safe system.   Brandy Oconnell was instructed to call 911 with any severe reactions post vaccine: Marland Kitchen Difficulty breathing  . Swelling of face and throat  . A fast heartbeat  . A bad rash all over body  . Dizziness and weakness   Immunizations Administered    Name Date Dose VIS Date Route   Pfizer COVID-19 Vaccine 02/13/2020 11:45 AM 0.3 mL 11/23/2019 Intramuscular   Manufacturer: [employer], [employer]   Lot: [id number]   NDC: [address]-7

## 2020-04-30 ENCOUNTER — Other Ambulatory Visit: Payer: Self-pay | Admitting: Cardiovascular Disease

## 2020-06-04 ENCOUNTER — Telehealth: Payer: Self-pay | Admitting: Internal Medicine

## 2020-06-04 MED ORDER — ALBUTEROL SULFATE HFA 108 (90 BASE) MCG/ACT IN AERS
2.0000 | INHALATION_SPRAY | Freq: Four times a day (QID) | RESPIRATORY_TRACT | 12 refills | Status: DC | PRN
Start: 2020-06-04 — End: 2021-10-29

## 2020-06-04 NOTE — Telephone Encounter (Signed)
Patient reports chest tight, hacking dry cough, occasional wheeze and thinks it is her mild intermittent asthma. Plan- albuterol hfa rescue inhaler sent to [facility], [employer].

## 2020-08-13 ENCOUNTER — Ambulatory Visit: Payer: Medicare Other | Admitting: Cardiovascular Disease

## 2020-08-17 ENCOUNTER — Other Ambulatory Visit: Payer: Self-pay | Admitting: Cardiovascular Disease

## 2020-09-12 ENCOUNTER — Other Ambulatory Visit: Payer: Self-pay

## 2020-09-12 ENCOUNTER — Encounter: Payer: Self-pay | Admitting: Cardiovascular Disease

## 2020-09-12 ENCOUNTER — Ambulatory Visit (INDEPENDENT_AMBULATORY_CARE_PROVIDER_SITE_OTHER): Payer: Medicare Other | Admitting: Cardiovascular Disease

## 2020-09-12 VITALS — BP 131/62 | HR 66 | Ht 62.0 in | Wt 218.8 lb

## 2020-09-12 DIAGNOSIS — I10 Essential (primary) hypertension: Secondary | ICD-10-CM

## 2020-09-12 DIAGNOSIS — R011 Cardiac murmur, unspecified: Secondary | ICD-10-CM

## 2020-09-12 DIAGNOSIS — E782 Mixed hyperlipidemia: Secondary | ICD-10-CM

## 2020-09-12 MED ORDER — VALSARTAN 320 MG PO TABS: 320.0000 mg | ORAL_TABLET | Freq: Every day | ORAL | 3 refills | Status: DC

## 2020-09-12 NOTE — Patient Instructions (Signed)

## 2020-09-12 NOTE — Progress Notes (Signed)
Patient ID: Brandy Oconnell, female   DOB: 1941-01-28, 79 y.o.   MRN: [id number]    Cardiology Office Note    Date:  09/12/2020   ID:  Brandy Oconnell, Brandy Oconnell 09/16/1941, MRN [id number]  PCP:  Adrian Prince, MD  Cardiologist:   Thurmon Fair, MD   Chief Complaint  Patient presents with  . Hypertension    hyperlipidemia    History of Present Illness:  Brandy Oconnell is a 79 y.o. female with systemic hypertension and hyperlipidemia returns for follow-up.   She has not had any new cardiovascular problems, mostly is limited by pain in her right knee and her lower back.  She has had to move into a new apartment on the fourth floor and the elevator is not working.  She is able to climb 4 flights of stairs only stopping once to catch her breath.  She denies chest discomfort.  She has not had problems with lower extremity edema, orthopnea or PND.  She occasionally has shortness of breath at rest that is improved by using her albuterol inhaler.  She is back on valsartan.  The olmesartan was "too strong".  Labs performed June 30 and Dr. Rinaldo Cloud office showed mostly favorable findings.  The total cholesterol is 159, triglycerides 176, HDL 43, calculated LDL 81 (she was not fasting at the time).  Her hemoglobin A1c was 5.9%.  Routine lab tests were otherwise all normal including a creatinine of 0.8, potassium 4.1 and hemoglobin 14.1  Has had difficulty identifying an effective treatment for her blood pressure due to side effects with numerous medications. She developed massive uncomfortable leg edema with calcium channel blockers (both amlodipine and verapamil) and did not tolerate beta blockers. She has asthma. She developed a cough with ACE inhibitors. She has no side effects with valsartan, but was intolerant of irbesartan. She has a history of calcium oxalate kidney stones. She also has hyperlipidemia but has been intolerant to multiple statins including daily rosuvastatin, pravastatin and Vytorin. Also  did not tolerate Zetia. She seems to tolerate twice weekly rosuvastatin without complaints, but her lipid profile is not quite at target.  She does not have known coronary or peripheral arterial obstructive disease.  Past Medical History:  Diagnosis Date  . ALLERGIC RHINITIS   . Asthma   . Bronchiectasis   . Complication of anesthesia    BP bottomed out and they had to put IV back in me.  . Degenerative arthritis   . Depression   . Essential and other specified forms of tremor 09/13/2013  . Fibromyalgia   . Hyperlipidemia   . Hypertension   . Hypothyroidism   . Kidney stone   . Migraine   . Migraine without aura, without mention of intractable migraine without mention of status migrainosus 09/13/2013  . Obesity   . Palpitation   . Pelvic fracture (HCC)   . PONV (postoperative nausea and vomiting)     Past Surgical History:  Procedure Laterality Date  . CYSTOSCOPY WITH RETROGRADE PYELOGRAM, URETEROSCOPY AND STENT PLACEMENT Left 10/04/2017   Procedure: CYSTOSCOPY WITH RETROGRADE PYELOGRAM, URETEROSCOPY AND STENT PLACEMENT;  Surgeon: Crista Elliot, MD;  Location: [facility];  Service: Urology;  Laterality: Left;  . gyn laparoscopy    . LAPAROSCOPIC HYSTERECTOMY    . NASAL SEPTUM SURGERY    . VESICOVAGINAL FISTULA CLOSURE W/ TAH  1984    Outpatient Medications Prior to Visit  Medication Sig Dispense Refill  . albuterol (VENTOLIN HFA) 108 (90 Base) MCG/ACT  inhaler Inhale 2 puffs into the lungs every 6 (six) hours as needed for wheezing or shortness of breath. 18 g 12  . aspirin 81 MG tablet Take 81 mg by mouth daily.    . famotidine (PEPCID) 10 MG tablet Take 10 mg by mouth as needed.     . furosemide (LASIX) 40 MG tablet Take 20-40 mg by mouth daily.     Marland Kitchen levothyroxine (SYNTHROID, LEVOTHROID) 112 MCG tablet Take 112 mcg by mouth daily. Takes  One full tablet five days a week then takes half a tablet on Saturdays.    Marland Kitchen loratadine (CLARITIN) 10 MG tablet Take 10 mg by mouth daily  as needed for allergies.     Marland Kitchen LORazepam (ATIVAN) 1 MG tablet Take 1 tablet (1 mg total) by mouth 2 (two) times daily as needed for anxiety. (Patient taking differently: Take 1 mg by mouth at bedtime. ) 30 tablet 5  . rosuvastatin (CRESTOR) 10 MG tablet TAKE 1 TABLET BY MOUTH  DAILY 30 tablet 1  . Vitamin D, Cholecalciferol, 1000 UNITS TABS Take 5,000 Units by mouth every evening.     . valsartan (DIOVAN) 320 MG tablet Take 1 tablet (320 mg total) by mouth daily. 90 tablet 3  . amoxicillin (AMOXIL) 500 MG tablet Take 1 tablet (500 mg total) by mouth 2 (two) times daily. 14 tablet 1   No facility-administered medications prior to visit.     Allergies:   Erythromycin, Latex, Ace inhibitors, Amoxicillin, Beta adrenergic blockers, Codeine, Doxycycline, Tape, Amlodipine, Pneumovax 23 [pneumococcal vac polyvalent], Sulfonamide derivatives, and Tramadol   Social History   Socioeconomic History  . Marital status: Divorced    Spouse name: Not on file  . Number of children: 0  . Years of education: COLLEGE  . Highest education level: Not on file  Occupational History  . Occupation: full time clinical lab tech  Tobacco Use  . Smoking status: Never Smoker  . Smokeless tobacco: Never Used  Substance and Sexual Activity  . Alcohol use: No  . Drug use: No  . Sexual activity: Not on file  Other Topics Concern  . Not on file  Social History Narrative   Patient does not drink caffeine.   Patient is right handed.   Social Determinants of Health   Financial Resource Strain:   . Difficulty of Paying Living Expenses: Not on file  Food Insecurity:   . Worried About Programme researcher, broadcasting/film/video in the Last Year: Not on file  . Ran Out of Food in the Last Year: Not on file  Transportation Needs:   . Lack of Transportation (Medical): Not on file  . Lack of Transportation (Non-Medical): Not on file  Physical Activity:   . Days of Exercise per Week: Not on file  . Minutes of Exercise per Session: Not on  file  Stress:   . Feeling of Stress : Not on file  Social Connections:   . Frequency of Communication with Friends and Family: Not on file  . Frequency of Social Gatherings with Friends and Family: Not on file  . Attends Religious Services: Not on file  . Active Member of Clubs or Organizations: Not on file  . Attends Banker Meetings: Not on file  . Marital Status: Not on file     Family History:  The patient's family history includes Asthma in her mother; Bladder Cancer in her father; COPD in her father; Cancer in her mother; Diabetes in her maternal grandmother, mother, and  sister; Headache in her mother; Heart attack in her brother and maternal aunt; Heart disease in her brother, maternal grandmother, mother, and sister; Hypertension in her sister; Kidney cancer in her mother and paternal grandfather; Stroke in her father and mother; Tremor in her maternal grandmother, mother, and sister.   ROS:   Please see the history of present illness.    ROS All other systems are reviewed and are negative  PHYSICAL EXAM:   VS:  BP 131/62   Pulse 66   Ht 5\' 2"  (1.575 m)   Wt 218 lb 12.8 oz (99.2 kg)   SpO2 98%   BMI 40.02 kg/m      General: Alert, oriented x3, no distress, morbidly obese Head: no evidence of trauma, PERRL, EOMI, no exophtalmos or lid lag, no myxedema, no xanthelasma; normal ears, nose and oropharynx Neck: normal jugular venous pulsations and no hepatojugular reflux; brisk carotid pulses without delay and no carotid bruits Chest: clear to auscultation, no signs of consolidation by percussion or palpation, normal fremitus, symmetrical and full respiratory excursions Cardiovascular: normal position and quality of the apical impulse, regular rhythm, normal first and second heart sounds, very faint early systolic murmur in the aortic position, no diastolic murmurs, rubs or gallops Abdomen: no tenderness or distention, no masses by palpation, no abnormal pulsatility  or arterial bruits, normal bowel sounds, no hepatosplenomegaly Extremities: no clubbing, cyanosis or edema; 2+ radial, ulnar and brachial pulses bilaterally; 2+ right femoral, posterior tibial and dorsalis pedis pulses; 2+ left femoral, posterior tibial and dorsalis pedis pulses; no subclavian or femoral bruits Neurological: grossly nonfocal Psych: Normal mood and affect     Wt Readings from Last 3 Encounters:  09/12/20 218 lb 12.8 oz (99.2 kg)  07/24/19 220 lb 9.6 oz (100.1 kg)  03/02/18 210 lb (95.3 kg)      Studies/Labs Reviewed:   EKG:  EKG is ordered today.  It shows normal sinus rhythm and is a normal tracing except for low voltage QRS related to obesity  Recent Labs: June 04, 2019 Total cholesterol 178, triglycerides 140, HDL 50, LDL 100 Creatinine 0.8, potassium 4.4, normal liver function tests, hemoglobin 13.7, TSH 0.51, hemoglobin A1c 5.7%  June 11, 2020 Total cholesterol 159, triglycerides 176, HDL 43, calculated LDL 81 (she was not fasting at the time).   Hemoglobin A1c 5.9%, creatinine 0.8, potassium 4.1, hemoglobin 14.1   ASSESSMENT:    No diagnosis found.   PLAN:  In order of problems listed above:  1. HTN: Felt poorly on olmesartan, back on valsartan with good blood pressure control. 2. HLP: Satisfactory lipid profile with mild hypertriglyceridemia and fairly low HDL cholesterol, no reason to change pharmacological treatment, but needs to avoid saturated fat and carbohydrates and increase intake of unsaturated fats and lean proteins.  Try to exercise as much as her knee will allow. 3. Obesity: Right knee and lumbar spine pain limits ability to exercise. 4. Murmur: Very faint today.  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  Despite her weight and orthopedic problems she has good functional capacity.  Her shortness of breath appears to be related to reactive airway disease.    Medication Adjustments/Labs and Tests Ordered: Current medicines are reviewed at length with  the patient today.  Concerns regarding medicines are outlined above.  Medication changes, Labs and Tests ordered today are listed in the Patient Instructions below. Patient Instructions  Medication Instructions:  No changes *If you need a refill on your cardiac medications before your next appointment, please call your pharmacy*  Lab Work: None ordered If you have labs (blood work) drawn today and your tests are completely normal, you will receive your results only by: Marland Kitchen MyChart Message (if you have MyChart) OR . A paper copy in the mail If you have any lab test that is abnormal or we need to change your treatment, we will call you to review the results.   Testing/Procedures: None ordered   Follow-Up: At [facility], you and your health needs are our priority.  As part of our continuing mission to provide you with exceptional heart care, we have created designated Provider Care Teams.  These Care Teams include your primary Cardiologist (physician) and Advanced Practice Providers (APPs -  Physician Assistants and Nurse Practitioners) who all work together to provide you with the care you need, when you need it.  We recommend signing up for the patient portal called "MyChart".  Sign up information is provided on this After Visit Summary.  MyChart is used to connect with patients for Virtual Visits (Telemedicine).  Patients are able to view lab/test results, encounter notes, upcoming appointments, etc.  Non-urgent messages can be sent to your provider as well.   To learn more about what you can do with MyChart, go to [email or web address].    Your next appointment:   12 month(s)  The format for your next appointment:   In Person  Provider:   You may see Thurmon Fair, MD or one of the following Advanced Practice Providers on your designated Care Team:    Azalee Course, PA-C  Micah Flesher, [place] or   Judy Pimple, PA-C         Signed, Thurmon Fair, MD  09/12/2020 9:24  AM    [facility] [address] [place], [place], [place]  [address] Phone: [phone]; Fax: [phone]

## 2020-09-15 NOTE — Addendum Note (Signed)
Addended by: Brunetta Genera on: 09/15/2020 02:48 PM   Modules accepted: Orders

## 2020-09-22 ENCOUNTER — Other Ambulatory Visit: Payer: Self-pay | Admitting: Endocrinology

## 2020-09-22 DIAGNOSIS — Z1231 Encounter for screening mammogram for malignant neoplasm of breast: Secondary | ICD-10-CM

## 2020-11-12 ENCOUNTER — Ambulatory Visit: Payer: Medicare Other

## 2020-12-23 ENCOUNTER — Other Ambulatory Visit: Payer: Self-pay | Admitting: Cardiovascular Disease

## 2020-12-24 ENCOUNTER — Other Ambulatory Visit: Payer: Self-pay

## 2020-12-24 ENCOUNTER — Ambulatory Visit
Admission: RE | Admit: 2020-12-24 | Discharge: 2020-12-24 | Disposition: A | Discharge status: Home / Self Care | Payer: Medicare Other | Source: Ambulatory Visit | Attending: Endocrinology | Admitting: Endocrinology

## 2020-12-24 DIAGNOSIS — Z1231 Encounter for screening mammogram for malignant neoplasm of breast: Secondary | ICD-10-CM

## 2021-09-25 ENCOUNTER — Telehealth: Payer: Self-pay | Admitting: Internal Medicine

## 2021-09-25 MED ORDER — AMOXICILLIN 500 MG PO TABS: ORAL_TABLET | ORAL | 1 refills | Status: DC

## 2021-09-25 NOTE — Telephone Encounter (Signed)
Requested amoxacillin for sinus infection. Says not allergic to amoxacillin. Augmentin causes GI upset. Amox sent to her [facility].

## 2021-10-23 ENCOUNTER — Ambulatory Visit: Payer: Medicare Other | Admitting: Cardiovascular Disease

## 2021-10-29 ENCOUNTER — Telehealth: Payer: Self-pay | Admitting: Internal Medicine

## 2021-10-29 MED ORDER — ALBUTEROL SULFATE (2.5 MG/3ML) 0.083% IN NEBU: 2.5000 mg | INHALATION_SOLUTION | Freq: Four times a day (QID) | RESPIRATORY_TRACT | 12 refills | Status: DC | PRN

## 2021-10-29 MED ORDER — ALBUTEROL SULFATE HFA 108 (90 BASE) MCG/ACT IN AERS: 2.0000 | INHALATION_SPRAY | Freq: Four times a day (QID) | RESPIRATORY_TRACT | 12 refills | Status: DC | PRN

## 2021-10-29 NOTE — Telephone Encounter (Signed)
Mild intermittent wheeze. No distress, ut with winter coming, she asks we refill neb solution and rescue inhaler at [facility]

## 2021-11-11 ENCOUNTER — Ambulatory Visit: Payer: Medicare Other | Admitting: Cardiovascular Disease

## 2021-11-16 ENCOUNTER — Telehealth: Payer: Self-pay | Admitting: *Deleted

## 2021-11-16 MED ORDER — VALSARTAN 320 MG PO TABS: 320.0000 mg | ORAL_TABLET | Freq: Every day | ORAL | 1 refills | Status: DC

## 2021-11-16 NOTE — Telephone Encounter (Signed)
Please advise if this refill is appropriate. Pt has an upcoming appointment at the end of December. Diovan wasn't listed on pts medication list at pts last ov.

## 2021-11-16 NOTE — Telephone Encounter (Signed)
Refill request from [facility] came to our office for Diovan. However it was not listed as patient's preferred pharmacy. This is a [facility] Patient.

## 2021-11-16 NOTE — Telephone Encounter (Signed)
Noted. Refill of Diovan was sent to pts pharmacy with 1 refill. Pt needs to keep upcoming appointment for additional refills.

## 2021-11-17 ENCOUNTER — Other Ambulatory Visit: Payer: Self-pay | Admitting: Endocrinology

## 2021-11-17 DIAGNOSIS — Z1231 Encounter for screening mammogram for malignant neoplasm of breast: Secondary | ICD-10-CM

## 2021-12-04 ENCOUNTER — Ambulatory Visit: Payer: Medicare Other | Admitting: Cardiovascular Disease

## 2021-12-30 ENCOUNTER — Ambulatory Visit
Admission: RE | Admit: 2021-12-30 | Discharge: 2021-12-30 | Disposition: A | Discharge status: Home / Self Care | Payer: Medicare Other | Source: Ambulatory Visit | Attending: Endocrinology | Admitting: Endocrinology

## 2021-12-30 DIAGNOSIS — Z1231 Encounter for screening mammogram for malignant neoplasm of breast: Secondary | ICD-10-CM

## 2022-01-12 ENCOUNTER — Other Ambulatory Visit: Payer: Self-pay | Admitting: Cardiovascular Disease

## 2022-01-14 ENCOUNTER — Other Ambulatory Visit: Payer: Self-pay

## 2022-01-14 ENCOUNTER — Ambulatory Visit: Payer: Medicare Other | Admitting: Cardiovascular Disease

## 2022-01-14 ENCOUNTER — Encounter: Payer: Self-pay | Admitting: Cardiovascular Disease

## 2022-01-14 VITALS — BP 140/82 | HR 80 | Ht 63.0 in | Wt 225.8 lb

## 2022-01-14 DIAGNOSIS — I10 Essential (primary) hypertension: Secondary | ICD-10-CM

## 2022-01-14 DIAGNOSIS — E782 Mixed hyperlipidemia: Secondary | ICD-10-CM

## 2022-01-14 DIAGNOSIS — R011 Cardiac murmur, unspecified: Secondary | ICD-10-CM | POA: Diagnosis not present

## 2022-01-14 NOTE — Patient Instructions (Signed)

## 2022-01-14 NOTE — Progress Notes (Signed)
Patient ID: Brandy Oconnell, female   DOB: 02/15/1941, 81 y.o.   MRN: [id number]    Cardiology Office Note    Date:  01/15/2022   ID:  Brandy Oconnell, DOB 12/23/1940, MRN [id number]  PCP:  Adrian PrinceSouth, Stephen, MD  Cardiologist:   Thurmon FairMihai Kialee Kham, MD   Chief Complaint  Patient presents with   Hypertension         History of Present Illness:  Brandy Oconnell is a 81 y.o. female with systemic hypertension and hyperlipidemia returns for follow-up.   Other than COVID 19 upper respiratory infection symptoms in October, she has generally had an uneventful year.  She continues to have limitations due to pain in her right knee and her lower back which worsens during walking and limits activity.  She is thinking of using a recumbent bike.  The patient specifically denies any chest pain at rest exertion, dyspnea at rest or with exertion, orthopnea, paroxysmal nocturnal dyspnea, syncope, palpitations, focal neurological deficits, intermittent claudication, lower extremity edema, unexplained weight gain, cough, hemoptysis or wheezing.  She finds that the current valsartan generic product she is taking does not work as well as the previous Retail buyergeneric manufactured by [employer].  Her systolic blood pressure is now closer to 140 rather than 130s.  She has not had any serious blood pressure elevations.  Labs performed 06/05/2021 show satisfactory lipid parameters and a hemoglobin A1c from earlier this year was good at 5.9%.  She has normal renal function.  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  Has had difficulty identifying an effective treatment for her blood pressure due to side effects with numerous medications. She developed massive uncomfortable leg edema with calcium channel blockers (both amlodipine and verapamil) and did not tolerate beta blockers. She has asthma. She developed a cough with ACE inhibitors. She has no side effects with valsartan, but was intolerant of irbesartan.   Olmesartan was "too strong".  She has a history of calcium oxalate kidney stones. She also has hyperlipidemia but has been intolerant to multiple statins including daily rosuvastatin, pravastatin and Vytorin. Also did not tolerate Zetia. She seems to tolerate twice weekly rosuvastatin without complaints, but her lipid profile is not quite at target.  She does not have known coronary or peripheral arterial obstructive disease.  Past Medical History:  Diagnosis Date   ALLERGIC RHINITIS    Asthma    Bronchiectasis    Complication of anesthesia    BP bottomed out and they had to put IV back in me.   Degenerative arthritis    Depression    Essential and other specified forms of tremor 09/13/2013   Fibromyalgia    Hyperlipidemia    Hypertension    Hypothyroidism    Kidney stone    Migraine    Migraine without aura, without mention of intractable migraine without mention of status migrainosus 09/13/2013   Obesity    Palpitation    Pelvic fracture (HCC)    PONV (postoperative nausea and vomiting)     Past Surgical History:  Procedure Laterality Date   CYSTOSCOPY WITH RETROGRADE PYELOGRAM, URETEROSCOPY AND STENT PLACEMENT Left 10/04/2017   Procedure: CYSTOSCOPY WITH RETROGRADE PYELOGRAM, URETEROSCOPY AND STENT PLACEMENT;  Surgeon: Crista ElliotBell, Eugene D III, MD;  Location: [facility];  Service: Urology;  Laterality: Left;   gyn laparoscopy     LAPAROSCOPIC HYSTERECTOMY     NASAL SEPTUM SURGERY     VESICOVAGINAL FISTULA CLOSURE W/ TAH  1984  Outpatient Medications Prior to Visit  Medication Sig Dispense Refill   albuterol (PROVENTIL) (2.5 MG/3ML) 0.083% nebulizer solution Take 3 mLs (2.5 mg total) by nebulization every 6 (six) hours as needed for wheezing or shortness of breath. 75 mL 12   albuterol (VENTOLIN HFA) 108 (90 Base) MCG/ACT inhaler Inhale 2 puffs into the lungs every 6 (six) hours as needed for wheezing or shortness of breath. 18 g 12   aspirin 81 MG tablet Take 81 mg by mouth daily.      famotidine (PEPCID) 10 MG tablet Take 10 mg by mouth as needed.      furosemide (LASIX) 40 MG tablet Take 20-40 mg by mouth daily.     levothyroxine (SYNTHROID, LEVOTHROID) 112 MCG tablet Take 112 mcg by mouth daily. Takes  One full tablet five days a week then takes half a tablet on Saturdays.     loratadine (CLARITIN) 10 MG tablet Take 10 mg by mouth daily as needed for allergies.      LORazepam (ATIVAN) 1 MG tablet Take 1 tablet (1 mg total) by mouth 2 (two) times daily as needed for anxiety. (Patient taking differently: Take 1 mg by mouth at bedtime.) 30 tablet 5   rosuvastatin (CRESTOR) 10 MG tablet TAKE 1 TABLET BY MOUTH  DAILY 60 tablet 5   valsartan (DIOVAN) 320 MG tablet Take 1 tablet (320 mg total) by mouth daily. 90 tablet 1   Vitamin D, Cholecalciferol, 1000 UNITS TABS Take 5,000 Units by mouth every evening.      traMADol (ULTRAM) 50 MG tablet Take 50 mg by mouth daily as needed. Takes 1/2 tablet PRN     amoxicillin (AMOXIL) 500 MG tablet 1 twice daily (Patient not taking: Reported on 01/14/2022) 14 tablet 1   No facility-administered medications prior to visit.     Allergies:   Erythromycin, Latex, Ace inhibitors, Amoxicillin, Beta adrenergic blockers, Codeine, Doxycycline, Tape, Amlodipine, Pneumovax 23 [pneumococcal vac polyvalent], Sulfonamide derivatives, and Tramadol   Social History   Socioeconomic History   Marital status: Divorced    Spouse name: Not on file   Number of children: 0   Years of education: COLLEGE   Highest education level: Not on file  Occupational History   Occupation: full time clinical lab tech  Tobacco Use   Smoking status: Never   Smokeless tobacco: Never  Substance and Sexual Activity   Alcohol use: No   Drug use: No   Sexual activity: Not on file  Other Topics Concern   Not on file  Social History Narrative   Patient does not drink caffeine.   Patient is right handed.   Social Determinants of Health   Financial Resource Strain: Not  on file  Food Insecurity: Not on file  Transportation Needs: Not on file  Physical Activity: Not on file  Stress: Not on file  Social Connections: Not on file     Family History:  The patient's family history includes Asthma in her mother; Bladder Cancer in her father; COPD in her father; Cancer in her mother; Diabetes in her maternal grandmother, mother, and sister; Headache in her mother; Heart attack in her brother and maternal aunt; Heart disease in her brother, maternal grandmother, mother, and sister; Hypertension in her sister; Kidney cancer in her mother and paternal grandfather; Stroke in her father and mother; Tremor in her maternal grandmother, mother, and sister.   ROS:   Please see the history of present illness.    ROS All other systems are  reviewed and are negative  PHYSICAL EXAM:   VS:  BP 140/82    Pulse 80    Ht 5\' 3"  (1.6 m)    Wt 225 lb 12.8 oz (102.4 kg)    SpO2 92%    BMI 40.00 kg/m      General: Alert, oriented x3, no distress, she is morbidly obese Head: no evidence of trauma, PERRL, EOMI, no exophtalmos or lid lag, no myxedema, no xanthelasma; normal ears, nose and oropharynx Neck: normal jugular venous pulsations and no hepatojugular reflux; brisk carotid pulses without delay and no carotid bruits Chest: clear to auscultation, no signs of consolidation by percussion or palpation, normal fremitus, symmetrical and full respiratory excursions Cardiovascular: normal position and quality of the apical impulse, regular rhythm, normal first and second heart sounds, 1/6 aortic ejection murmur early peaking, no diastolic murmurs, rubs or gallops Abdomen: no tenderness or distention, no masses by palpation, no abnormal pulsatility or arterial bruits, normal bowel sounds, no hepatosplenomegaly Extremities: no clubbing, cyanosis or edema; 2+ radial, ulnar and brachial pulses bilaterally; 2+ right femoral, posterior tibial and dorsalis pedis pulses; 2+ left femoral, posterior  tibial and dorsalis pedis pulses; no subclavian or femoral bruits Neurological: grossly nonfocal Psych: Normal mood and affect    Wt Readings from Last 3 Encounters:  01/14/22 225 lb 12.8 oz (102.4 kg)  09/12/20 218 lb 12.8 oz (99.2 kg)  07/24/19 220 lb 9.6 oz (100.1 kg)      Studies/Labs Reviewed:   EKG:  EKG is ordered today.  Personally viewed, shows normal sinus rhythm with a single PAC and minor nonspecific repolarization abnormalities.  Recent Labs: June 04, 2019 Total cholesterol 178, triglycerides 140, HDL 50, LDL 100 Creatinine 0.8, potassium 4.4, normal liver function tests, hemoglobin 13.7, TSH 0.51, hemoglobin A1c 5.7%  June 11, 2020 Total cholesterol 159, triglycerides 176, HDL 43, calculated LDL 81 (she was not fasting at the time).   Hemoglobin A1c 5.9%, creatinine 0.8, potassium 4.1, hemoglobin 14.1  June 05, 2021 Total cholesterol 149, HDL 44, LDL 74, triglycerides 154, Potassium 4.0, ALT 35, TSH 3.43 December 22, 2021 Hemoglobin A1c 5.9%   ASSESSMENT:    1. Essential hypertension   2. Mixed hyperlipidemia   3. Morbid obesity (HCC)   4. Systolic murmur      PLAN:  In order of problems listed above:  HTN: Felt poorly on olmesartan, back on valsartan with good blood pressure control, although the current generic product ("made in 12-09-1980") does not work as well for her as the previous one from [place].  We have had difficulty identifying a blood pressure control regimen for her and I would recommend staying on the same medication, try to see if her insurance company can get her her previous product. HLP: Fair lipid parameters. Obesity: Right knee and lumbar spine pain limits ability to exercise.  Encouraged her to consider recumbent bike. Murmur: Remains asymptomatic.  Suspect aortic valve sclerosis.  If she exhibits exertional angina/dyspnea/dizziness we will recommend echo to evaluate for aortic valve stenosis.    Medication Adjustments/Labs and Tests  Ordered: Current medicines are reviewed at length with the patient today.  Concerns regarding medicines are outlined above.  Medication changes, Labs and Tests ordered today are listed in the Patient Instructions below. Patient Instructions  Medication Instructions:  No changes *If you need a refill on your cardiac medications before your next appointment, please call your pharmacy*   Lab Work: None ordered If you have labs (blood work) drawn today and  your tests are completely normal, you will receive your results only by: MyChart Message (if you have MyChart) OR A paper copy in the mail If you have any lab test that is abnormal or we need to change your treatment, we will call you to review the results.   Testing/Procedures: None ordered   Follow-Up: At [facility], you and your health needs are our priority.  As part of our continuing mission to provide you with exceptional heart care, we have created designated Provider Care Teams.  These Care Teams include your primary Cardiologist (physician) and Advanced Practice Providers (APPs -  Physician Assistants and Nurse Practitioners) who all work together to provide you with the care you need, when you need it.  We recommend signing up for the patient portal called "MyChart".  Sign up information is provided on this After Visit Summary.  MyChart is used to connect with patients for Virtual Visits (Telemedicine).  Patients are able to view lab/test results, encounter notes, upcoming appointments, etc.  Non-urgent messages can be sent to your provider as well.   To learn more about what you can do with MyChart, go to [email or web address].    Your next appointment:   12 month(s)  The format for your next appointment:   In Person  Provider:   Thurmon FairMihai Rosezella Kronick, MD {       Signed, Thurmon FairMihai Taressa Rauh, MD  01/15/2022 12:06 PM    [facility] [address], [place], [place]  [address] Phone: [phone]; Fax:  [phone]

## 2022-01-15 ENCOUNTER — Encounter: Payer: Self-pay | Admitting: Cardiovascular Disease

## 2022-01-28 ENCOUNTER — Other Ambulatory Visit: Payer: Self-pay | Admitting: Cardiovascular Disease

## 2022-02-05 ENCOUNTER — Other Ambulatory Visit: Payer: Self-pay | Admitting: Cardiovascular Disease

## 2022-03-31 ENCOUNTER — Other Ambulatory Visit: Payer: Self-pay

## 2022-03-31 ENCOUNTER — Emergency Department (HOSPITAL_COMMUNITY)
Admission: EM | Admit: 2022-03-31 | Discharge: 2022-04-01 | Disposition: A | Discharge status: Home / Self Care | Payer: Medicare Other | Attending: Emergency Medicine | Admitting: Emergency Medicine

## 2022-03-31 ENCOUNTER — Encounter (HOSPITAL_COMMUNITY): Payer: Self-pay

## 2022-03-31 ENCOUNTER — Emergency Department (HOSPITAL_COMMUNITY): Payer: Medicare Other

## 2022-03-31 DIAGNOSIS — N200 Calculus of kidney: Secondary | ICD-10-CM

## 2022-03-31 DIAGNOSIS — Z7982 Long term (current) use of aspirin: Secondary | ICD-10-CM | POA: Diagnosis not present

## 2022-03-31 DIAGNOSIS — N132 Hydronephrosis with renal and ureteral calculous obstruction: Secondary | ICD-10-CM | POA: Insufficient documentation

## 2022-03-31 DIAGNOSIS — R1030 Lower abdominal pain, unspecified: Secondary | ICD-10-CM | POA: Diagnosis present

## 2022-03-31 DIAGNOSIS — Z91040 Latex allergy status: Secondary | ICD-10-CM | POA: Insufficient documentation

## 2022-03-31 DIAGNOSIS — N23 Unspecified renal colic: Secondary | ICD-10-CM

## 2022-03-31 LAB — COMPREHENSIVE METABOLIC PANEL
ALT: 27 U/L (ref 0–44)
AST: 29 U/L (ref 15–41)
Albumin: 4 g/dL (ref 3.5–5.0)
Alkaline Phosphatase: 90 U/L (ref 38–126)
Anion gap: 11 (ref 5–15)
BUN: 18 mg/dL (ref 8–23)
CO2: 24 mmol/L (ref 22–32)
Calcium: 9.1 mg/dL (ref 8.9–10.3)
Chloride: 106 mmol/L (ref 98–111)
Creatinine, Ser: 1.12 mg/dL — ABNORMAL HIGH (ref 0.44–1.00)
GFR, Estimated: 50 mL/min — ABNORMAL LOW (ref >60)
Glucose, Bld: 157 mg/dL — ABNORMAL HIGH (ref 70–99)
Potassium: 3.5 mmol/L (ref 3.5–5.1)
Sodium: 141 mmol/L (ref 135–145)
Total Bilirubin: 0.7 mg/dL (ref 0.3–1.2)
Total Protein: 7.8 g/dL (ref 6.5–8.1)

## 2022-03-31 LAB — CBC
HCT: 45.6 % (ref 36.0–46.0)
Hemoglobin: 14.8 g/dL (ref 12.0–15.0)
MCH: 28.8 pg (ref 26.0–34.0)
MCHC: 32.5 g/dL (ref 30.0–36.0)
MCV: 88.7 fL (ref 80.0–100.0)
Platelets: 261 10*3/uL (ref 150–400)
RBC: 5.14 MIL/uL — ABNORMAL HIGH (ref 3.87–5.11)
RDW: 13.1 % (ref 11.5–15.5)
WBC: 9.3 10*3/uL (ref 4.0–10.5)
nRBC: 0 % (ref 0.0–0.2)

## 2022-03-31 MED ORDER — SODIUM CHLORIDE 0.9 % IV BOLUS
500.0000 mL | Freq: Once | INTRAVENOUS | Status: AC
  Administered 2022-03-31: 500 mL via INTRAVENOUS

## 2022-03-31 MED ORDER — ONDANSETRON 4 MG PO TBDP: 4.0000 mg | ORAL_TABLET | Freq: Three times a day (TID) | ORAL | 0 refills | Status: AC | PRN

## 2022-03-31 MED ORDER — IBUPROFEN 600 MG PO TABS: 600.0000 mg | ORAL_TABLET | Freq: Four times a day (QID) | ORAL | 0 refills | Status: DC | PRN

## 2022-03-31 MED ORDER — KETOROLAC TROMETHAMINE 30 MG/ML IJ SOLN
30.0000 mg | Freq: Once | INTRAMUSCULAR | Status: AC
  Administered 2022-03-31: 30 mg via INTRAVENOUS
  Filled 2022-03-31: qty 1

## 2022-03-31 MED ORDER — FENTANYL CITRATE PF 50 MCG/ML IJ SOSY
75.0000 ug | PREFILLED_SYRINGE | Freq: Once | INTRAMUSCULAR | Status: AC
  Administered 2022-03-31: 75 ug via INTRAVENOUS
  Filled 2022-03-31: qty 2

## 2022-03-31 MED ORDER — OXYCODONE HCL 5 MG PO TABS: 5.0000 mg | ORAL_TABLET | Freq: Four times a day (QID) | ORAL | 0 refills | Status: DC | PRN

## 2022-03-31 MED ORDER — OXYCODONE-ACETAMINOPHEN 5-325 MG PO TABS
1.0000 | ORAL_TABLET | Freq: Once | ORAL | Status: AC
  Administered 2022-03-31: 1 via ORAL
  Filled 2022-03-31: qty 1

## 2022-03-31 MED ORDER — ONDANSETRON 4 MG PO TBDP
4.0000 mg | ORAL_TABLET | Freq: Once | ORAL | Status: AC
Start: 2022-03-31 — End: 2022-03-31
  Administered 2022-03-31: 4 mg via ORAL
  Filled 2022-03-31: qty 1

## 2022-03-31 NOTE — ED Provider Triage Note (Signed)
Emergency Medicine Provider Triage Evaluation Note ? ?Brandy Oconnell , a 81 y.o. female  was evaluated in triage.  Pt complains of left sided flank pain since this afternoon. Pt states it feels like her previous kidney stones.  ? ?Review of Systems  ?Positive: Flank pain, nausea ?Negative: fever ? ?Physical Exam  ?BP (!) 173/86 (BP Location: Left Arm)   Pulse 66   Temp 97.6 ?F (36.4 ?C) (Oral)   Resp 18   SpO2 95%  ?Gen:   Awake, no distress   ?Resp:  Normal effort  ?MSK:   Moves extremities without difficulty  ?Other:   ? ?Medical Decision Making  ?Medically screening exam initiated at 7:37 PM.  Appropriate orders placed.  Brandy Oconnell was informed that the remainder of the evaluation will be completed by another provider, this initial triage assessment does not replace that evaluation, and the importance of remaining in the ED until their evaluation is complete. ? ? ?  ?Su Monks, PA-C ?03/31/22 1938 ? ?

## 2022-03-31 NOTE — ED Triage Notes (Signed)
Pt presents to ED with c/o left side flank pain. Pt endorses N/V and states she has had difficulty urinating for 2-3 days. Hx kidney stones  ?

## 2022-03-31 NOTE — ED Provider Notes (Signed)
?[facility]-EMERGENCY DEPT ?Provider Note ? ? ?CSN: [id number] ?Arrival date & time: 03/31/22  1859 ? ?  ? ?History ? ?Chief Complaint  ?Patient presents with  ? Flank Pain  ? ? ?Brandy Oconnell is a 81 y.o. female presented emerged department flank pain.  Acute onset this afternoon around 4 PM.  She is having difficulty with urinating.  She says it feels like her prior kidney stones.  Pain is 10 out of 10. ? ?HPI ? ?  ? ?Home Medications ?Prior to Admission medications   ?Medication Sig Start Date End Date Taking? Authorizing Provider  ?ibuprofen (ADVIL) 600 MG tablet Take 1 tablet (600 mg total) by mouth every 6 (six) hours as needed for up to 30 doses for mild pain or moderate pain. 03/31/22  Yes Tyiana Hill, Kermit Balo, MD  ?ondansetron (ZOFRAN-ODT) 4 MG disintegrating tablet Take 1 tablet (4 mg total) by mouth every 8 (eight) hours as needed for up to 12 doses for nausea or vomiting. 03/31/22  Yes Fynlee Rowlands, Kermit Balo, MD  ?oxyCODONE (ROXICODONE) 5 MG immediate release tablet Take 1 tablet (5 mg total) by mouth every 6 (six) hours as needed for up to 12 doses for severe pain. 03/31/22  Yes Malaya Cagley, Kermit Balo, MD  ?albuterol (PROVENTIL) (2.5 MG/3ML) 0.083% nebulizer solution Take 3 mLs (2.5 mg total) by nebulization every 6 (six) hours as needed for wheezing or shortness of breath. 10/29/21   Jetty Duhamel D, MD  ?albuterol (VENTOLIN HFA) 108 (90 Base) MCG/ACT inhaler Inhale 2 puffs into the lungs every 6 (six) hours as needed for wheezing or shortness of breath. 10/29/21   Jetty Duhamel D, MD  ?aspirin 81 MG tablet Take 81 mg by mouth daily.    [provider]  ?famotidine (PEPCID) 10 MG tablet Take 10 mg by mouth as needed.     [provider]  ?furosemide (LASIX) 40 MG tablet Take 20-40 mg by mouth daily.    [provider]  ?levothyroxine (SYNTHROID, LEVOTHROID) 112 MCG tablet Take 112 mcg by mouth daily. Takes  One full tablet five days a week then takes half a tablet on  Saturdays.    [provider]  ?loratadine (CLARITIN) 10 MG tablet Take 10 mg by mouth daily as needed for allergies.     [provider]  ?LORazepam (ATIVAN) 1 MG tablet Take 1 tablet (1 mg total) by mouth 2 (two) times daily as needed for anxiety. ?Patient taking differently: Take 1 mg by mouth at bedtime. 10/10/14   Jetty Duhamel D, MD  ?rosuvastatin (CRESTOR) 10 MG tablet TAKE 1 TABLET BY MOUTH  DAILY 01/28/22   Croitoru, Mihai, MD  ?traMADol (ULTRAM) 50 MG tablet Take 50 mg by mouth daily as needed. Takes 1/2 tablet PRN 09/10/21   [provider]  ?valsartan (DIOVAN) 320 MG tablet TAKE 1 TABLET BY MOUTH DAILY 02/05/22   Croitoru, Mihai, MD  ?Vitamin D, Cholecalciferol, 1000 UNITS TABS Take 5,000 Units by mouth every evening.     [provider]  ?   ? ?Allergies    ?Erythromycin, Latex, Ace inhibitors, Amoxicillin, Beta adrenergic blockers, Codeine, Doxycycline, Tape, Amlodipine, Pneumovax 23 [pneumococcal vac polyvalent], Sulfonamide derivatives, and Tramadol   ? ?Review of Systems   ?Review of Systems ? ?Physical Exam ?Updated Vital Signs ?BP (!) 157/74   Pulse (!) 55   Temp 97.6 ?F (36.4 ?C) (Oral)   Resp 18   SpO2 96%  ?Physical Exam ?Constitutional:   ?  General: She is not in acute distress. ?HENT:  ?   Head: Normocephalic and atraumatic.  ?Eyes:  ?   Conjunctiva/sclera: Conjunctivae normal.  ?   Pupils: Pupils are equal, round, and reactive to light.  ?Cardiovascular:  ?   Rate and Rhythm: Normal rate and regular rhythm.  ?Pulmonary:  ?   Effort: Pulmonary effort is normal. No respiratory distress.  ?Abdominal:  ?   General: There is no distension.  ?   Tenderness: There is no abdominal tenderness.  ?Skin: ?   General: Skin is warm and dry.  ?Neurological:  ?   General: No focal deficit present.  ?   Mental Status: She is alert. Mental status is at baseline.  ?Psychiatric:     ?   Mood and Affect: Mood normal.     ?   Behavior: Behavior normal.  ? ? ?ED Results /  Procedures / Treatments   ?Labs ?(all labs ordered are listed, but only abnormal results are displayed) ?Labs Reviewed  ?CBC - Abnormal; Notable for the following components:  ?    Result Value  ? RBC 5.14 (*)   ? All other components within normal limits  ?COMPREHENSIVE METABOLIC PANEL - Abnormal; Notable for the following components:  ? Glucose, Bld 157 (*)   ? Creatinine, Ser 1.12 (*)   ? GFR, Estimated 50 (*)   ? All other components within normal limits  ?URINALYSIS, ROUTINE W REFLEX MICROSCOPIC  ? ? ?EKG ?None ? ?Radiology ?CT Renal Stone Study ? ?Result Date: 03/31/2022 ?CLINICAL DATA:  Nephrolithiasis. EXAM: CT ABDOMEN AND PELVIS WITHOUT CONTRAST TECHNIQUE: Multidetector CT imaging of the abdomen and pelvis was performed following the standard protocol without IV contrast. RADIATION DOSE REDUCTION: This exam was performed according to the departmental dose-optimization program which includes automated exposure control, adjustment of the mA and/or kV according to patient size and/or use of iterative reconstruction technique. COMPARISON:  CT stone 10/04/2017. FINDINGS: Lower chest: No acute abnormality. Hepatobiliary: No focal liver abnormality is seen. No gallstones, gallbladder wall thickening, or biliary dilatation. Pancreas: Unremarkable. No pancreatic ductal dilatation or surrounding inflammatory changes. Spleen: Normal in size without focal abnormality. Adrenals/Urinary Tract: There is a 4 mm calculus in the distal left ureter. There is mild left-sided hydronephrosis and perinephric fat stranding. There are additional punctate bilateral renal calculi. There is scarring in the superior pole the right kidney. The adrenal glands and bladder are within normal limits. Stomach/Bowel: Stomach is within normal limits. Appendix appears normal. No evidence of bowel wall thickening, distention, or inflammatory changes. There are few scattered colonic diverticula. Vascular/Lymphatic: Aortic atherosclerosis. No  enlarged abdominal or pelvic lymph nodes. Reproductive: Status post hysterectomy. No adnexal masses. Other: No abdominal wall hernia or abnormality. No abdominopelvic ascites. Musculoskeletal: No fracture is seen. IMPRESSION: 1. 4 mm calculus in the distal left ureter with mild obstructive uropathy. 2. Additional nonobstructing bilateral renal calculi. Electronically Signed   By: Darliss CheneyAmy  Guttmann M.D.   On: 03/31/2022 19:59   ? ?Procedures ?Procedures  ? ? ?Medications Ordered in ED ?Medications  ?oxyCODONE-acetaminophen (PERCOCET/ROXICET) 5-325 MG per tablet 1 tablet (1 tablet Oral Given 03/31/22 2008)  ?ondansetron (ZOFRAN-ODT) disintegrating tablet 4 mg (4 mg Oral Given 03/31/22 2008)  ?fentaNYL (SUBLIMAZE) injection 75 mcg (75 mcg Intravenous Given 03/31/22 2053)  ?ketorolac (TORADOL) 30 MG/ML injection 30 mg (30 mg Intravenous Given 03/31/22 2052)  ?sodium chloride 0.9 % bolus 500 mL (0 mLs Intravenous Stopped 03/31/22 2203)  ? ? ?ED Course/ Medical Decision Making/ A&P ?  Clinical Course as of 03/31/22 2353  ?Wed Mar 31, 2022  ?2245 Pain is significantly improved on reassessment, now 1 out of 10.  She will attempt to provide a urine sample at this time. [MT]  ?  ?Clinical Course User Index ?[MT] Terald Sleeper, MD  ? ?                        ?Medical Decision Making ?Amount and/or Complexity of Data Reviewed ?Labs: ordered. ? ?Risk ?Prescription drug management. ? ? ?Patient is here with acute onset left-sided flank pain, differential diagnosis would include ureteral colic versus kidney stone versus infection versus other ? ?CT scan renal study shows a 4 mm distal left ureteral stone with some mild to moderate hydronephrosis.  Basic labs reviewed showing normal creatinine level and no leukocytosis. ? ?Patient was given pain medication with some improvement overall of her pain.  I explained the stone the size should likely pass on its own.  She has adverse reactions to Flomax and will not be eligible for this at  home. ? ? ? ? ? ? ? ?Final Clinical Impression(s) / ED Diagnoses ?Final diagnoses:  ?Ureteral colic  ?Kidney stone  ? ? ?Rx / DC Orders ?ED Discharge Orders   ? ?      Ordered  ?  oxyCODONE (ROXICODONE) 5 MG immediate

## 2022-04-01 LAB — URINALYSIS, ROUTINE W REFLEX MICROSCOPIC
Bilirubin Urine: NEGATIVE
Glucose, UA: NEGATIVE mg/dL
Ketones, ur: 20 mg/dL — AB
Nitrite: NEGATIVE
Protein, ur: 30 mg/dL — AB
Specific Gravity, Urine: 1.017 (ref 1.005–1.030)
pH: 6 (ref 5.0–8.0)

## 2022-04-06 ENCOUNTER — Ambulatory Visit: Payer: Medicare Other | Admitting: Cardiovascular Disease

## 2022-11-12 ENCOUNTER — Ambulatory Visit (HOSPITAL_COMMUNITY): Payer: Medicare Other

## 2022-11-12 ENCOUNTER — Ambulatory Visit (HOSPITAL_COMMUNITY)
Admission: AD | Admit: 2022-11-12 | Discharge: 2022-11-12 | Disposition: A | Discharge status: Home / Self Care | Payer: Medicare Other | Source: Ambulatory Visit | Attending: Urology | Admitting: Urology

## 2022-11-12 ENCOUNTER — Ambulatory Visit (HOSPITAL_COMMUNITY): Payer: Medicare Other | Admitting: Anesthesiology

## 2022-11-12 ENCOUNTER — Encounter (HOSPITAL_COMMUNITY): Payer: Self-pay | Admitting: Urology

## 2022-11-12 ENCOUNTER — Other Ambulatory Visit: Payer: Self-pay | Admitting: Urology

## 2022-11-12 ENCOUNTER — Encounter (HOSPITAL_COMMUNITY)
Admission: AD | Disposition: A | Discharge status: Home / Self Care | Payer: Self-pay | Source: Ambulatory Visit | Attending: Urology

## 2022-11-12 ENCOUNTER — Ambulatory Visit (HOSPITAL_BASED_OUTPATIENT_CLINIC_OR_DEPARTMENT_OTHER): Payer: Medicare Other | Admitting: Anesthesiology

## 2022-11-12 DIAGNOSIS — J45909 Unspecified asthma, uncomplicated: Secondary | ICD-10-CM | POA: Diagnosis not present

## 2022-11-12 DIAGNOSIS — J449 Chronic obstructive pulmonary disease, unspecified: Secondary | ICD-10-CM | POA: Insufficient documentation

## 2022-11-12 DIAGNOSIS — Z6836 Body mass index (BMI) 36.0-36.9, adult: Secondary | ICD-10-CM | POA: Insufficient documentation

## 2022-11-12 DIAGNOSIS — E039 Hypothyroidism, unspecified: Secondary | ICD-10-CM | POA: Diagnosis not present

## 2022-11-12 DIAGNOSIS — I10 Essential (primary) hypertension: Secondary | ICD-10-CM

## 2022-11-12 DIAGNOSIS — N201 Calculus of ureter: Secondary | ICD-10-CM

## 2022-11-12 DIAGNOSIS — N210 Calculus in bladder: Secondary | ICD-10-CM

## 2022-11-12 DIAGNOSIS — N132 Hydronephrosis with renal and ureteral calculous obstruction: Secondary | ICD-10-CM | POA: Insufficient documentation

## 2022-11-12 HISTORY — PX: CYSTOSCOPY/URETEROSCOPY/HOLMIUM LASER/STENT PLACEMENT: SHX6546

## 2022-11-12 HISTORY — DX: Unspecified glaucoma: H40.9

## 2022-11-12 LAB — BASIC METABOLIC PANEL
Anion gap: 8 (ref 5–15)
BUN: 12 mg/dL (ref 8–23)
CO2: 25 mmol/L (ref 22–32)
Calcium: 8.9 mg/dL (ref 8.9–10.3)
Chloride: 104 mmol/L (ref 98–111)
Creatinine, Ser: 1.09 mg/dL — ABNORMAL HIGH (ref 0.44–1.00)
GFR, Estimated: 52 mL/min — ABNORMAL LOW (ref >60)
Glucose, Bld: 117 mg/dL — ABNORMAL HIGH (ref 70–99)
Potassium: 4 mmol/L (ref 3.5–5.1)
Sodium: 137 mmol/L (ref 135–145)

## 2022-11-12 LAB — CBC
HCT: 44.6 % (ref 36.0–46.0)
Hemoglobin: 14.4 g/dL (ref 12.0–15.0)
MCH: 28.2 pg (ref 26.0–34.0)
MCHC: 32.3 g/dL (ref 30.0–36.0)
MCV: 87.3 fL (ref 80.0–100.0)
Platelets: 247 10*3/uL (ref 150–400)
RBC: 5.11 MIL/uL (ref 3.87–5.11)
RDW: 13.1 % (ref 11.5–15.5)
WBC: 12 10*3/uL — ABNORMAL HIGH (ref 4.0–10.5)
nRBC: 0 % (ref 0.0–0.2)

## 2022-11-12 SURGERY — CYSTOSCOPY/URETEROSCOPY/HOLMIUM LASER/STENT PLACEMENT
Anesthesia: General | Laterality: Left

## 2022-11-12 MED ORDER — OXYCODONE HCL 5 MG PO TABS: 5.0000 mg | ORAL_TABLET | Freq: Once | ORAL | Status: DC | PRN

## 2022-11-12 MED ORDER — LACTATED RINGERS IV SOLN: INTRAVENOUS | Status: DC

## 2022-11-12 MED ORDER — CIPROFLOXACIN IN D5W 400 MG/200ML IV SOLN
400.0000 mg | INTRAVENOUS | Status: AC
  Administered 2022-11-12: 400 mg via INTRAVENOUS
  Filled 2022-11-12: qty 200

## 2022-11-12 MED ORDER — 0.9 % SODIUM CHLORIDE (POUR BTL) OPTIME
TOPICAL | Status: DC | PRN
  Administered 2022-11-12: 1000 mL

## 2022-11-12 MED ORDER — SODIUM CHLORIDE 0.9 % IR SOLN
Status: DC | PRN
  Administered 2022-11-12: 3000 mL via INTRAVESICAL

## 2022-11-12 MED ORDER — OXYCODONE HCL 5 MG/5ML PO SOLN: 5.0000 mg | Freq: Once | ORAL | Status: DC | PRN

## 2022-11-12 MED ORDER — FENTANYL CITRATE (PF) 100 MCG/2ML IJ SOLN
INTRAMUSCULAR | Status: AC
  Filled 2022-11-12: qty 2

## 2022-11-12 MED ORDER — ACETAMINOPHEN 10 MG/ML IV SOLN: 1000.0000 mg | Freq: Once | INTRAVENOUS | Status: DC | PRN

## 2022-11-12 MED ORDER — IOHEXOL 300 MG/ML  SOLN
INTRAMUSCULAR | Status: DC | PRN
  Administered 2022-11-12: 7 mL

## 2022-11-12 MED ORDER — ONDANSETRON HCL 4 MG/2ML IJ SOLN
INTRAMUSCULAR | Status: AC
  Filled 2022-11-12: qty 2

## 2022-11-12 MED ORDER — SCOPOLAMINE 1 MG/3DAYS TD PT72
MEDICATED_PATCH | TRANSDERMAL | Status: DC | PRN
  Administered 2022-11-12: 1 via TRANSDERMAL

## 2022-11-12 MED ORDER — HYDROCODONE-ACETAMINOPHEN 5-325 MG PO TABS: 1.0000 | ORAL_TABLET | ORAL | 0 refills | Status: DC | PRN

## 2022-11-12 MED ORDER — LIDOCAINE 2% (20 MG/ML) 5 ML SYRINGE
INTRAMUSCULAR | Status: DC | PRN
  Administered 2022-11-12: 100 mg via INTRAVENOUS

## 2022-11-12 MED ORDER — FENTANYL CITRATE PF 50 MCG/ML IJ SOSY: 25.0000 ug | PREFILLED_SYRINGE | INTRAMUSCULAR | Status: DC | PRN

## 2022-11-12 MED ORDER — DEXAMETHASONE SODIUM PHOSPHATE 10 MG/ML IJ SOLN
INTRAMUSCULAR | Status: AC
  Filled 2022-11-12: qty 1

## 2022-11-12 MED ORDER — ONDANSETRON HCL 4 MG PO TABS: 4.0000 mg | ORAL_TABLET | Freq: Every day | ORAL | 1 refills | Status: AC | PRN

## 2022-11-12 MED ORDER — DEXAMETHASONE SODIUM PHOSPHATE 10 MG/ML IJ SOLN
INTRAMUSCULAR | Status: DC | PRN
  Administered 2022-11-12: 10 mg via INTRAVENOUS

## 2022-11-12 MED ORDER — ONDANSETRON HCL 4 MG/2ML IJ SOLN: 4.0000 mg | Freq: Once | INTRAMUSCULAR | Status: DC | PRN

## 2022-11-12 MED ORDER — PROPOFOL 500 MG/50ML IV EMUL
INTRAVENOUS | Status: DC | PRN
  Administered 2022-11-12: 200 mg via INTRAVENOUS

## 2022-11-12 MED ORDER — OXYBUTYNIN CHLORIDE 5 MG PO TABS: 5.0000 mg | ORAL_TABLET | Freq: Three times a day (TID) | ORAL | 1 refills | Status: AC | PRN

## 2022-11-12 MED ORDER — FENTANYL CITRATE (PF) 100 MCG/2ML IJ SOLN
INTRAMUSCULAR | Status: DC | PRN
  Administered 2022-11-12 (×2): 25 ug via INTRAVENOUS
  Administered 2022-11-12: 50 ug via INTRAVENOUS

## 2022-11-12 MED ORDER — ONDANSETRON HCL 4 MG/2ML IJ SOLN
INTRAMUSCULAR | Status: DC | PRN
  Administered 2022-11-12: 4 mg via INTRAVENOUS

## 2022-11-12 MED ORDER — CHLORHEXIDINE GLUCONATE 0.12 % MT SOLN
15.0000 mL | Freq: Once | OROMUCOSAL | Status: AC
  Administered 2022-11-12: 15 mL via OROMUCOSAL

## 2022-11-12 MED ORDER — CIPROFLOXACIN HCL 500 MG PO TABS: 500.0000 mg | ORAL_TABLET | Freq: Two times a day (BID) | ORAL | 0 refills | Status: AC

## 2022-11-12 MED ORDER — PROPOFOL 10 MG/ML IV BOLUS
INTRAVENOUS | Status: AC
  Filled 2022-11-12: qty 20

## 2022-11-12 MED ORDER — SCOPOLAMINE 1 MG/3DAYS TD PT72
MEDICATED_PATCH | TRANSDERMAL | Status: AC
  Filled 2022-11-12: qty 1

## 2022-11-12 SURGICAL SUPPLY — 22 items
BAG URO CATCHER STRL LF (MISCELLANEOUS) ×1 IMPLANT
BASKET ZERO TIP NITINOL 2.4FR (BASKET) IMPLANT
BSKT STON RTRVL ZERO TP 2.4FR (BASKET) ×1
CATH URETL OPEN 5X70 (CATHETERS) ×1 IMPLANT
CLOTH BEACON ORANGE TIMEOUT ST (SAFETY) ×1 IMPLANT
EXTRACTOR STONE NITINOL NGAGE (UROLOGICAL SUPPLIES) IMPLANT
GLOVE SURG LX STRL 7.5 STRW (GLOVE) ×1 IMPLANT
GOWN SRG XL LVL 4 BRTHBL STRL (GOWNS) ×1 IMPLANT
GOWN STRL NON-REIN XL LVL4 (GOWNS) ×1
GUIDEWIRE STR DUAL SENSOR (WIRE) IMPLANT
GUIDEWIRE ZIPWRE .038 STRAIGHT (WIRE) ×1 IMPLANT
KIT TURNOVER KIT A (KITS) ×1 IMPLANT
LASER FIB FLEXIVA PULSE ID 365 (Laser) IMPLANT
MANIFOLD NEPTUNE II (INSTRUMENTS) ×1 IMPLANT
PACK CYSTO (CUSTOM PROCEDURE TRAY) ×1 IMPLANT
SHEATH NAVIGATOR HD 11/13X36 (SHEATH) IMPLANT
STENT URET 6FRX24 CONTOUR (STENTS) IMPLANT
STENT URET 6FRX26 CONTOUR (STENTS) IMPLANT
TRACTIP FLEXIVA PULS ID 200XHI (Laser) IMPLANT
TRACTIP FLEXIVA PULSE ID 200 (Laser) IMPLANT
TUBING CONNECTING 10 (TUBING) ×1 IMPLANT
TUBING UROLOGY SET (TUBING) ×1 IMPLANT

## 2022-11-12 NOTE — Anesthesia Preprocedure Evaluation (Signed)
Anesthesia Evaluation  Patient identified by MRN, date of birth, ID band Patient awake    Reviewed: Allergy & Precautions, H&P , NPO status , Patient's Chart, lab work & pertinent test results  History of Anesthesia Complications (+) PONV and history of anesthetic complications  Airway Mallampati: III  TM Distance: <3 FB Neck ROM: Full    Dental no notable dental hx.    Pulmonary asthma , COPD,  COPD inhaler   Pulmonary exam normal breath sounds clear to auscultation       Cardiovascular hypertension, Normal cardiovascular exam Rhythm:Regular Rate:Normal     Neuro/Psych negative neurological ROS  negative psych ROS   GI/Hepatic negative GI ROS, Neg liver ROS,,,  Endo/Other  Hypothyroidism  Morbid obesity  Renal/GU negative Renal ROS  negative genitourinary   Musculoskeletal  (+)  Fibromyalgia -  Abdominal   Peds negative pediatric ROS (+)  Hematology negative hematology ROS (+)   Anesthesia Other Findings   Reproductive/Obstetrics negative OB ROS                             Anesthesia Physical Anesthesia Plan  ASA: 3  Anesthesia Plan: General   Post-op Pain Management: Minimal or no pain anticipated   Induction: Intravenous  PONV Risk Score and Plan: 4 or greater and Ondansetron, Dexamethasone and Treatment may vary due to age or medical condition  Airway Management Planned: LMA  Additional Equipment:   Intra-op Plan:   Post-operative Plan: Extubation in OR  Informed Consent: I have reviewed the patients History and Physical, chart, labs and discussed the procedure including the risks, benefits and alternatives for the proposed anesthesia with the patient or authorized representative who has indicated his/her understanding and acceptance.     Dental advisory given  Plan Discussed with: CRNA and Surgeon  Anesthesia Plan Comments:        Anesthesia Quick  Evaluation

## 2022-11-12 NOTE — Anesthesia Procedure Notes (Signed)
Procedure Name: LMA Insertion Date/Time: 11/12/2022 6:01 PM  Performed by: Basilio Cairo, CRNAPre-anesthesia Checklist: Patient identified, Patient being monitored, Timeout performed, Emergency Drugs available and Suction available Patient Re-evaluated:Patient Re-evaluated prior to induction Oxygen Delivery Method: Circle system utilized Preoxygenation: Pre-oxygenation with 100% oxygen Induction Type: IV induction Ventilation: Mask ventilation without difficulty LMA: LMA inserted LMA Size: 4.0 Tube type: Oral Number of attempts: 1 Placement Confirmation: positive ETCO2 and breath sounds checked- equal and bilateral Tube secured with: Tape Dental Injury: Teeth and Oropharynx as per pre-operative assessment

## 2022-11-12 NOTE — H&P (Signed)
PRE-OP H&P  Office Visit Report     11/12/2022   --------------------------------------------------------------------------------   Brandy Oconnell  [id number]  DOB: 08/30/1941, 81 year old Female  PRIMARY CARE:  [facility], [place]  PRIMARY CARE FAX:  [phone]  REFERRING:  Wonda Cheng. Williams Che,   PROVIDER:  Nicolette Bang, M.D.  TREATING:  Ellison Hughs, M.D.  LOCATION:  [facility] [phone]     --------------------------------------------------------------------------------   CC: I have pain in the flank.  HPI: Brandy Oconnell is a 81 year-old female established patient who is here for flank pain.  The problem is on the left side. Her pain started about 11/12/2022. The pain is sharp. The pain is constant. The pain does radiate.   None< makes the pain better. Nothing causes the pain to become worse. She has not been treated with any pain medications.   She has had this same pain previously. She has had kidney stones.   The patient presents today with 10 out of 10 left flank pain associated with nausea and vomiting and chills. Afebrile and vital signs are stable in the office today. She has a long history of stones and has required multiple interventions in the past. She states that she has never been able to successfully pass a stone. She is urinating without difficulty denies dysuria or hematuria. She was unable to leave a urine specimen today.     ALLERGIES: Ace Inhibitors amlodipine Beta Adrenergic Blockers Codeine Derivatives - Vomiting, Nausea Doxycycline Erythromycin Derivatives - Other Reaction, chest pain Latex Pneumococcal Vac Polyvalent Sulfonamide Derivatives - Skin Rash tamsulosin - Other Reaction, chest pain TraMADol HCl TABS - Other Reaction, hallucinations.    MEDICATIONS: Ketorolac Tromethamine 10 mg tablet 1 tablet PO Q 8 H PRN  Aspir 81 81 mg tablet, delayed release Oral  Claritin  Furosemide 40 mg tablet   Latanoprost 0.005 % drops  Levothyroxine Sodium 112 mcg tablet Oral  Lorazepam 1 mg tablet Oral  Pepcid  Probiotic  Rosuvastatin Calcium 10 mg tablet  Tylenol 325 mg capsule  Valsartan 320 mg tablet  Vitamin D3     GU PSH: Cysto Remove Stent FB Sim - 04/14/2022 Cystoscopy Insert Stent - 04/07/2022, Left - 2018 Hysterectomy Unilat SO - 2015 Ureteroscopic stone removal - 04/07/2022, Left - 2018       PSH Notes: ESI 09/08/17   NON-GU PSH: Adhesiolysis (laparoscopic)-Inactive - 2015 Nose Surgery (Unspecified) Repair Bladder-vagina Lesion - 2015     GU PMH: Renal calculus - 05/27/2022, - 06/02/2021, - 2018, Renal calculus, right, - 2016 Renal and ureteral calculus - 04/14/2022 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 No change in distal left UVJ caclculus and at this time with pain and vomiting pt would like to proceed with urgent cystourethroscopy, (L) RPG, stone extraction, and possible double J stent placement by on call (Dr. Gloriann Loan). , - 2018 (Improving, Acute), Left, Continue straining urine. F/U in 2 weeks. She may use OTC Tylenol/NSAID for pain and may use PRN Oxycodone she has on hand from ER. If stone captured bring to office. Instructed to contact office if she has any acute changes ie temp >100.5, vomiting, or chills. , - 2018, Ureteral calculus, right, - 2015 Other specified inflammation of vagina and vulva - 06/02/2021 Urinary Urgency - 2020    NON-GU PMH: Nausea with vomiting, unspecified (Acute), Promethazine 25 mg IM today. - 2018 Encounter for general adult medical examination without abnormal findings, Encounter for  preventive health examination - 2016 Palpitations, Palpitations - 2015 Personal history of (healed) traumatic fracture, History of fracture of pelvis - 2015 Personal history of other diseases of the circulatory system, History of hypertension - 2015 Personal history of other diseases of the musculoskeletal system and  connective tissue, History of fibromyalgia - 2015, History of osteoarthritis, - 2015 Personal history of other diseases of the nervous system and sense organs, History of migraine headaches - 2015 Personal history of other diseases of the respiratory system, History of bronchiectasis - 2015, History of asthma, - 2015 Personal history of other endocrine, nutritional and metabolic disease, History of hypothyroidism - 2015, History of obesity, - 2015, History of hyperlipidemia, - 2015 Personal history of other mental and behavioral disorders, History of depression - 2015 Tremor, unspecified, Tremor - 2015    FAMILY HISTORY: Asthma - Runs In Family Bladder Cancer - Runs in Family Carcinoma, renal cell - Runs In Family cardiac disorder - Runs In Family Chronic Obstructive Pulmonary Disease - Runs In Family Diabetes - Runs In Family Kidney Cancer - Runs In Family Strokes - Runs In Family tremor - Runs In Family   SOCIAL HISTORY: Marital Status: Single Preferred Language: English; Ethnicity: Not Hispanic Or Latino; Race: White Current Smoking Status: Patient has never smoked.   Tobacco Use Assessment Completed: Used Tobacco in last 30 days? Does not use smokeless tobacco. Has never drank.  Does not use drugs. Does not drink caffeine.    REVIEW OF SYSTEMS:    GU Review Female:   Patient denies frequent urination, hard to postpone urination, burning /pain with urination, get up at night to urinate, leakage of urine, stream starts and stops, trouble starting your stream, have to strain to urinate, and being pregnant.  Gastrointestinal (Upper):   Patient denies nausea, vomiting, and indigestion/ heartburn.  Gastrointestinal (Lower):   Patient denies diarrhea and constipation.  Constitutional:   Patient denies fever, night sweats, weight loss, and fatigue.  Skin:   Patient denies skin rash/ lesion and itching.  Eyes:   Patient denies blurred vision and double vision.  Ears/ Nose/ Throat:    Patient denies sore throat and sinus problems.  Hematologic/Lymphatic:   Patient denies swollen glands and easy bruising.  Cardiovascular:   Patient denies leg swelling and chest pains.  Respiratory:   Patient denies cough and shortness of breath.  Endocrine:   Patient denies excessive thirst.  Musculoskeletal:   Patient denies back pain and joint pain.  Neurological:   Patient denies headaches and dizziness.  Psychologic:   Patient denies depression and anxiety.   VITAL SIGNS:      11/12/2022 09:18 AM  Weight 204 lb / 92.53 kg  Height 63 in / 160.02 cm  BP 163/77 mmHg  Pulse 72 /min  Temperature 96.9 F / 36.0 C  BMI 36.1 kg/m   MULTI-SYSTEM PHYSICAL EXAMINATION:    Constitutional: Patient appears very uncomfortable and has a vomit bag readily available  Neurologic / Psychiatric: Oriented to time, oriented to place, oriented to person. No depression, no anxiety, no agitation.     Complexity of Data:  Records Review:   Previous Hospital Records, Previous Patient Records  X-Ray Review: C.T. Abdomen/Pelvis: Reviewed Films. Discussed With Patient.     PROCEDURES:         C.T. Urogram - [id number]      Patient confirmed No Neulasta OnPro Device.         Ketoralac 30mg  - , [id number] Patient was injected with 30 mg  of Ketorolac and 30 mg's was wasted   Qty: 30 Adm. By: Pricilla Riffle  Unit: mg Lot No [id number]  Route: IM Exp. Date 02/11/2023  Freq: None Mfgr.:   Site: Left Hip         Phenergan 25mg  - [id number], [id number] Patient was injected with 12.5 mg of Phenergan and 12.5 was wasted.   Qty: 12.5 Adm. By: Pricilla Riffle  Unit: mg Lot No [id number]  Route: IM Exp. Date 01/13/2023  Freq: None Mfgr.:   Site: Right Hip   ASSESSMENT:      ICD-10 Details  1 GU:   Flank Pain - R10.84 Left, Acute, Systemic Symptoms  2   Ureteral calculus - N20.1 Left, Acute, Systemic Symptoms  3   Ureteral obstruction secondary to calculous - N13.2 Left, Acute, Systemic Symptoms   PLAN:            Orders X-Rays: C.T. Stone Protocol Without I.V. Contrast  X-Ray Notes: History:   Hematuria: Yes/No   Patient to see MD after exam: Yes/No   Previous exam: CT / IVP/ US/ KUB/ None   When:   Where:   Diabetic: Yes/ No   BUN/ Creatinine:   Date of last BUN Creatinine:   Weight in pounds:   Allergy- IV Contrast: Yes/ No   Conflicting diabetic meds: Yes/ No   Diabetic Meds:   Prior Authorization #: [id number]           Schedule Return Visit/Planned Activity: Next Available Appointment - Schedule Surgery          Document Letter(s):  Created for Patient: Clinical Summary         Notes:   -5 mm left UVJ stone with concomitant hydronephrosis and perinephric stranding seen on CT today. Final read pending  -The risks, benefits and alternatives of cystoscopy with LEFT ureteroscopy, laser lithotripsy and ureteral stent placement was discussed the patient. Risks included, but are not limited to: bleeding, urinary tract infection, ureteral injury/avulsion, ureteral stricture formation, retained stone fragments, the possibility that multiple surgeries may be required to treat the stone(s), MI, stroke, PE and the inherent risks of general anesthesia. The patient voices understanding and wishes to proceed.

## 2022-11-12 NOTE — Op Note (Signed)
Operative Note  Preoperative diagnosis:  1.  5 mm left UVJ stone  Postoperative diagnosis: 1.  5 mm left UVJ stone  Procedure(s): 1.  Cystoscopy with left ureteroscopy, holmium laser lithotripsy and left J stent placement 2.  Left retrograde pyelogram with intraoperative interpretation of fluoroscopic imaging  Surgeon: Rhoderick Moody, MD  Assistants:  None  Anesthesia:  General  Complications:  None  EBL: Less than 5 mL  Specimens: 1.  Left ureteral stone fragments  Drains/Catheters: 1.  Left 6 French, 24 cm JJ stent without tether  Intraoperative findings:   Obstructing left UVJ calculus Left RPG revealed a filling defect within the distal aspects of the left ureter with proximal dilation had no other filling defects.  Indication:  Brandy Oconnell is a 81 y.o. female with an obstructing 5 mm left ureteral stone associated with renal colic.  She has been consented for the above procedures, voices understanding and wishes to proceed.  Description of procedure:  After informed consent was obtained, the patient was brought to the operating room and general LMA anesthesia was administered. The patient was then placed in the dorsolithotomy position and prepped and draped in the usual sterile fashion. A timeout was performed. A 23 French rigid cystoscope was then inserted into the urethral meatus and advanced into the bladder under direct vision. A complete bladder survey revealed no intravesical pathology.  A 5 French ureteral catheter was then inserted into the left ureteral orifice and a retrograde pyelogram was obtained, with the findings listed above.  A Glidewire was then used to intubate the lumen of the ureteral catheter and was advanced up to the left renal pelvis, under fluoroscopic guidance.  The catheter was then removed, leaving the wire in place.  A semirigid ureteroscope was then advanced into the distal aspects of the left ureter where her obstructing stone was  identified.  A 200 m holmium laser was then used to fracture the stone into several smaller pieces.  A 0 tip basket was then used to extract all stone fragments from the lumen of the left ureter.  The semirigid ureteroscope was then removed.  A 6 French, 24 cm JJ stent was then placed over the wire and into good position within the left collecting system, confirming placement via fluoroscopy.  The patient's bladder was drained and all stone fragments were removed.  She tolerated procedure well and was transferred to the postanesthesia in stable condition.  Plan: Discharge home

## 2022-11-12 NOTE — Discharge Instructions (Signed)

## 2022-11-16 ENCOUNTER — Encounter (HOSPITAL_COMMUNITY): Payer: Self-pay | Admitting: Urology

## 2022-11-16 NOTE — Transfer of Care (Signed)
Immediate Anesthesia Transfer of Care Note  Patient: Brandy Oconnell  Procedure(s) Performed: CYSTOSCOPY/URETEROSCOPY/HOLMIUM LASER/STENT PLACEMENT (Left)  Patient Location: PACU  Anesthesia Type:General  Level of Consciousness: drowsy and patient cooperative  Airway & Oxygen Therapy: Patient Spontanous Breathing and Patient connected to face mask oxygen  Post-op Assessment: Report given to RN and Post -op Vital signs reviewed and stable  Post vital signs: Reviewed and stable  Last Vitals:  Vitals Value Taken Time  BP 161/79 11/12/22 1930  Temp 36.6 C 11/12/22 1930  Pulse 60 11/12/22 1930  Resp 15 11/12/22 1930  SpO2 95 % 11/12/22 1930    Last Pain:  Vitals:   11/12/22 1930  TempSrc:   PainSc: 0-No pain         Complications: No notable events documented.

## 2022-11-16 NOTE — Anesthesia Postprocedure Evaluation (Signed)
Anesthesia Post Note  Patient: Brandy Oconnell  Procedure(s) Performed: CYSTOSCOPY/URETEROSCOPY/HOLMIUM LASER/STENT PLACEMENT (Left)     Patient location during evaluation: PACU Anesthesia Type: General Level of consciousness: awake and alert Pain management: pain level controlled Vital Signs Assessment: post-procedure vital signs reviewed and stable Respiratory status: spontaneous breathing, nonlabored ventilation, respiratory function stable and patient connected to nasal cannula oxygen Cardiovascular status: blood pressure returned to baseline and stable Postop Assessment: no apparent nausea or vomiting Anesthetic complications: no  No notable events documented.  Last Vitals:  Vitals:   11/12/22 1915 11/12/22 1930  BP: (!) 168/65 (!) 161/79  Pulse: (!) 59 60  Resp: 15 15  Temp:  36.6 C  SpO2: 95% 95%    Last Pain:  Vitals:   11/12/22 1930  TempSrc:   PainSc: 0-No pain                 [name] S

## 2022-11-24 ENCOUNTER — Other Ambulatory Visit: Payer: Self-pay | Admitting: Endocrinology

## 2022-11-24 DIAGNOSIS — Z1231 Encounter for screening mammogram for malignant neoplasm of breast: Secondary | ICD-10-CM

## 2023-01-19 ENCOUNTER — Ambulatory Visit: Payer: Medicare Other

## 2023-01-25 ENCOUNTER — Other Ambulatory Visit: Payer: Self-pay | Admitting: Cardiovascular Disease

## 2023-01-25 ENCOUNTER — Ambulatory Visit: Payer: Medicare Other

## 2023-02-14 ENCOUNTER — Ambulatory Visit
Admission: RE | Admit: 2023-02-14 | Discharge: 2023-02-14 | Disposition: A | Discharge status: Home / Self Care | Payer: Medicare Other | Source: Ambulatory Visit | Attending: Endocrinology | Admitting: Endocrinology

## 2023-02-14 DIAGNOSIS — Z1231 Encounter for screening mammogram for malignant neoplasm of breast: Secondary | ICD-10-CM

## 2023-02-16 IMAGING — CT CT RENAL STONE PROTOCOL
2 of 4 series · 16 of 46 positions shown, 18 images · non-contrast
Comparison: CT stone 10/04/2017.

CLINICAL DATA: Nephrolithiasis.



[Series 2: axial st · axial · 0.84mm/px · z∈[-290,+90]mm · 13 of 88 slices shown, 15 images]
[im 6/88  soft-tissue]
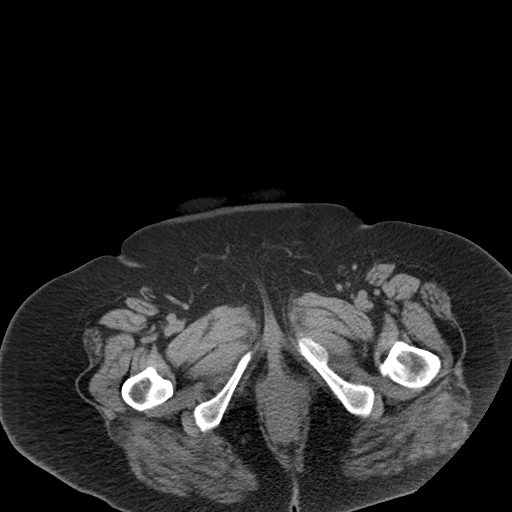
[im 6/88  bone]
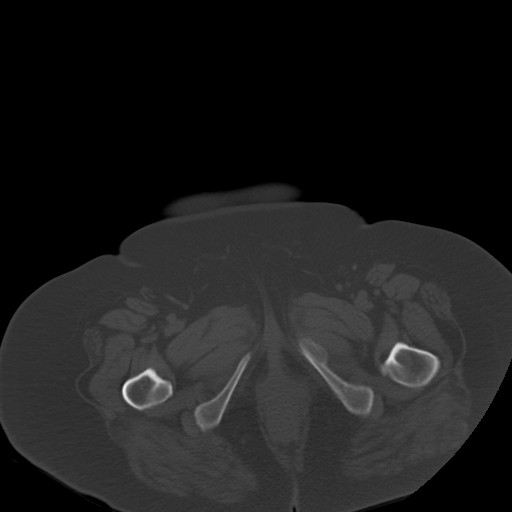
[im 11/88  soft-tissue]
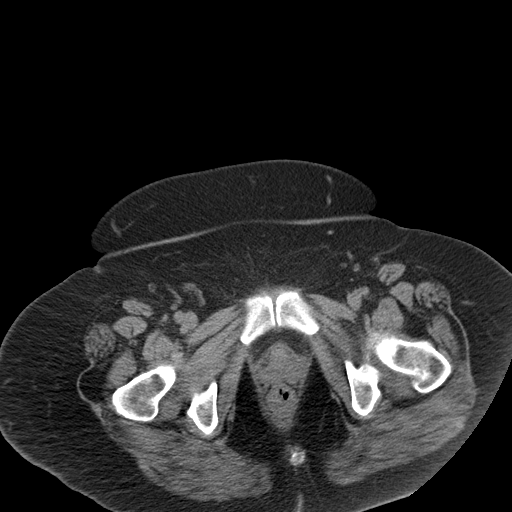
[im 17/88  soft-tissue]
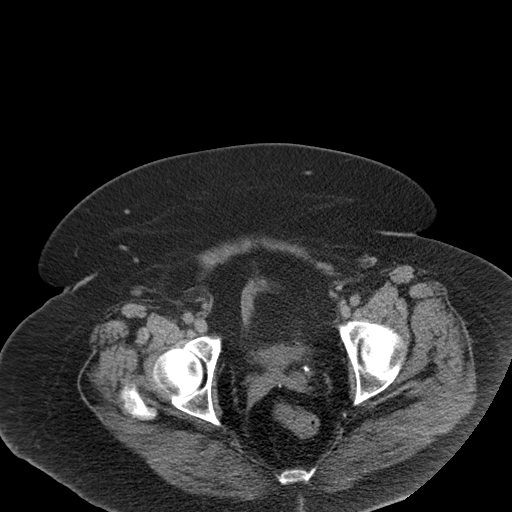
[im 28/88  soft-tissue]
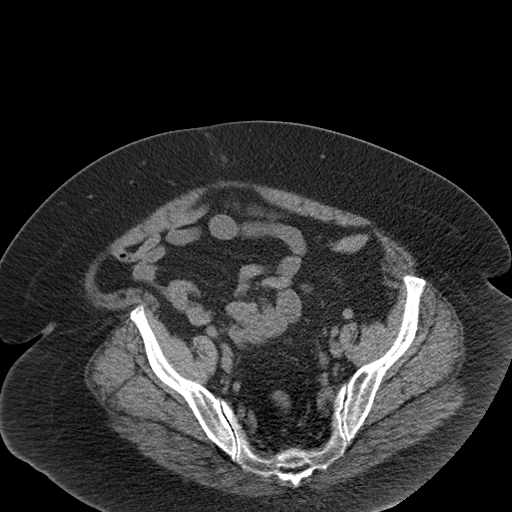
[im 33/88  soft-tissue]
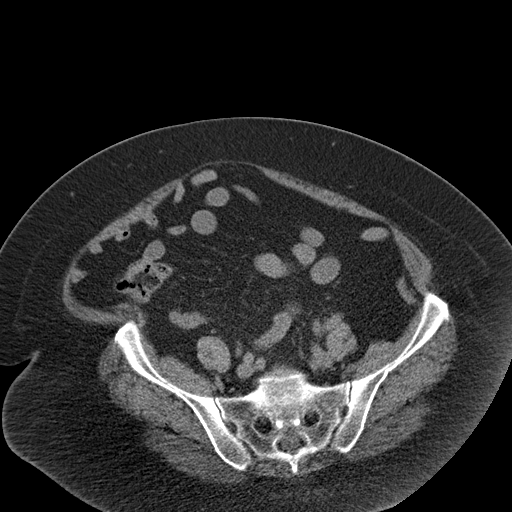
[im 39/88  soft-tissue]
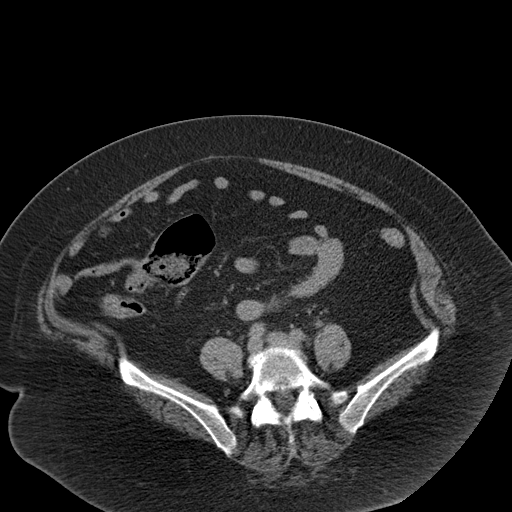
[im 44/88  soft-tissue]
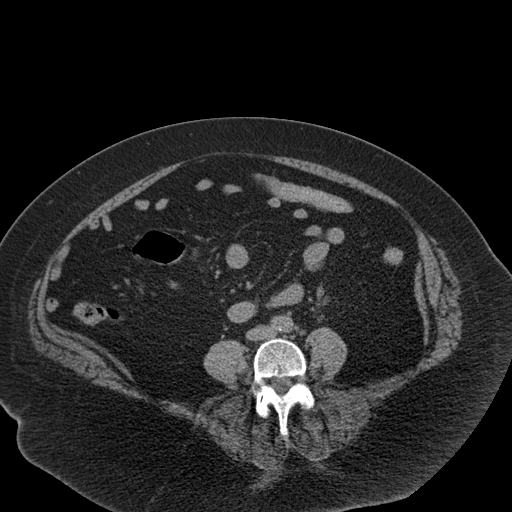
[im 49/88  soft-tissue]
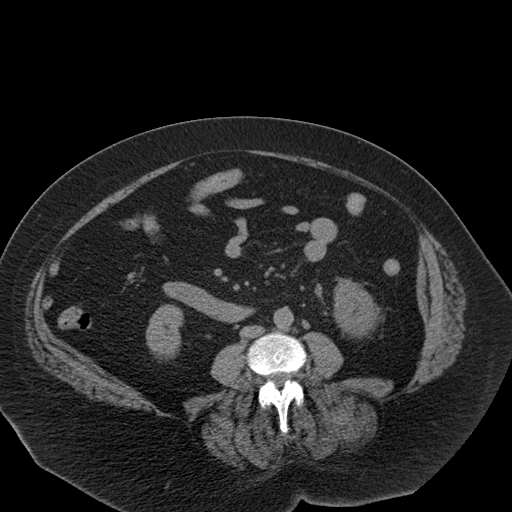
[im 55/88  soft-tissue]
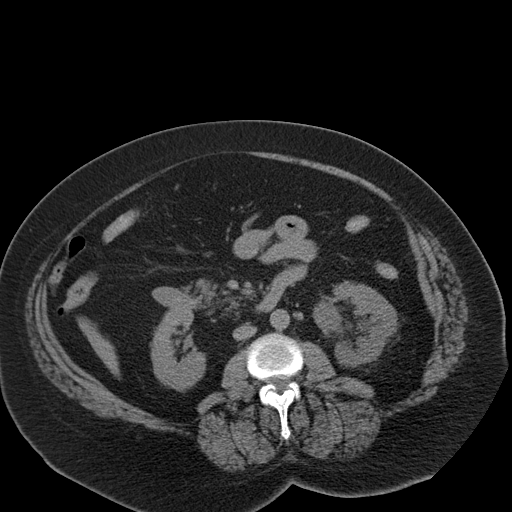
[im 55/88  bone]
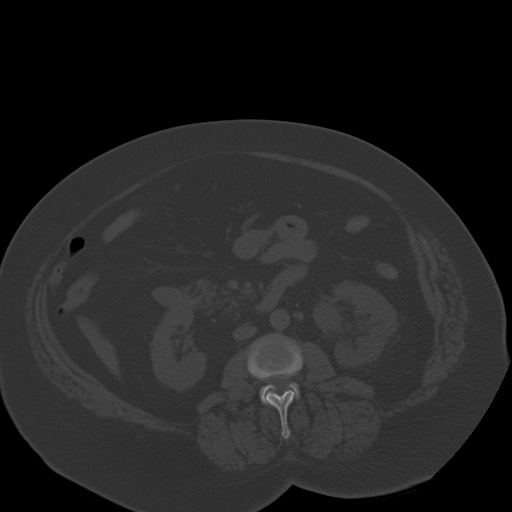
[im 60/88  soft-tissue]
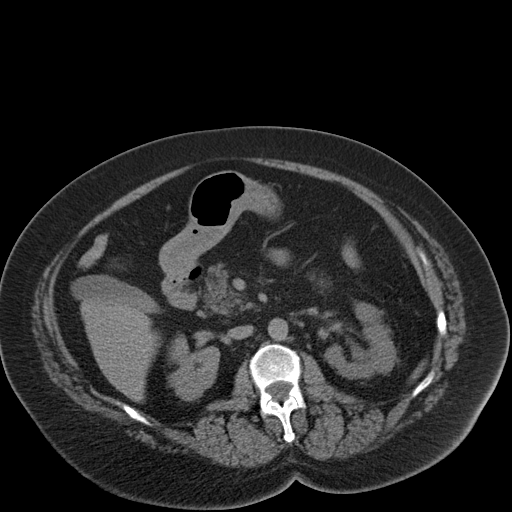
[im 71/88  soft-tissue]
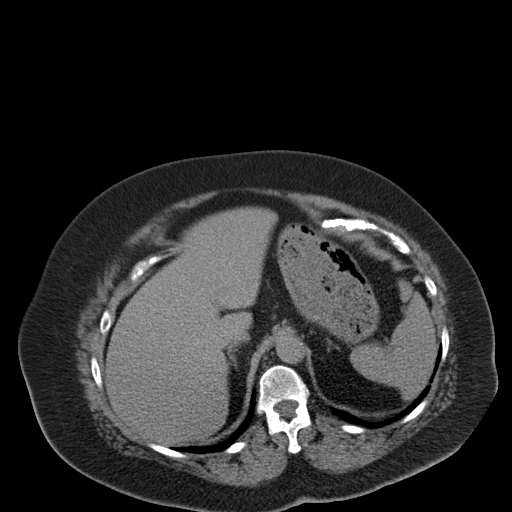
[im 77/88  soft-tissue]
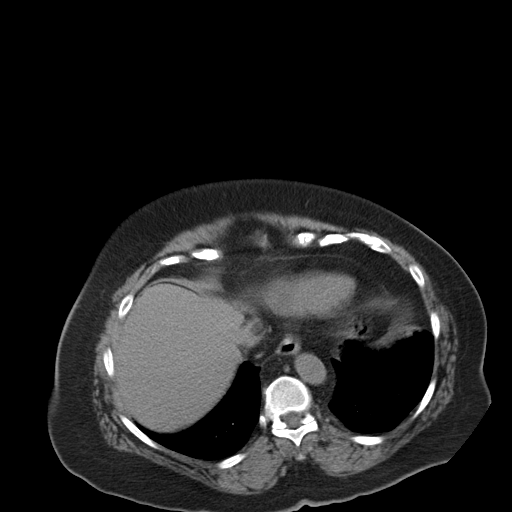
[im 82/88  soft-tissue]
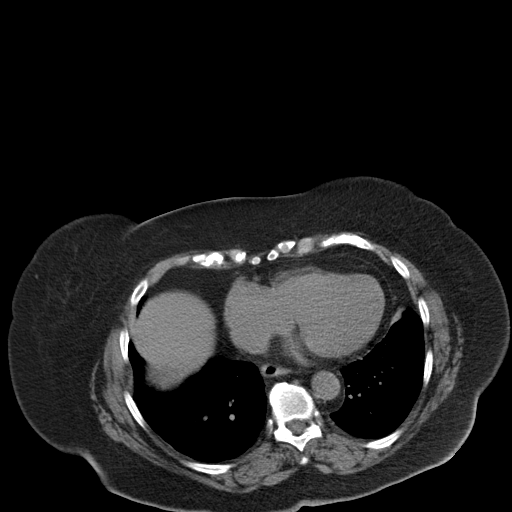

[Series 5: coronal · coronal · 0.90mm/px · 3 of 187 slices shown]
[im 63/187  soft-tissue]
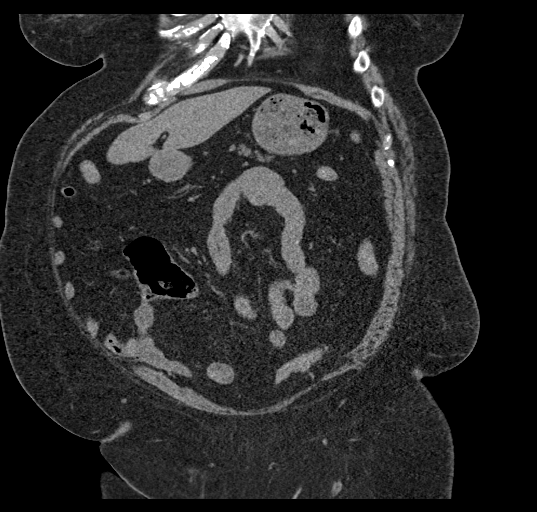
[im 83/187  soft-tissue]
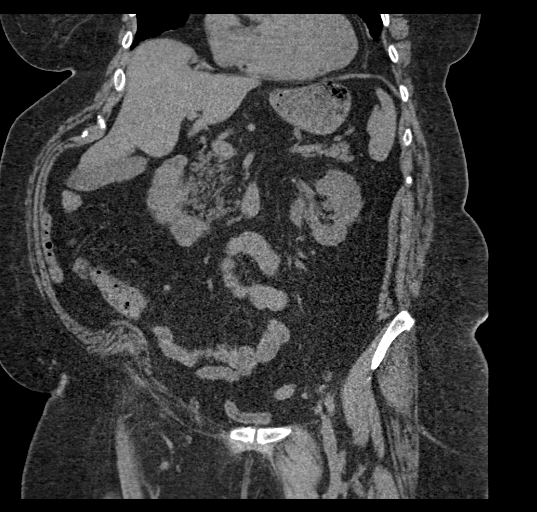
[im 104/187  soft-tissue]
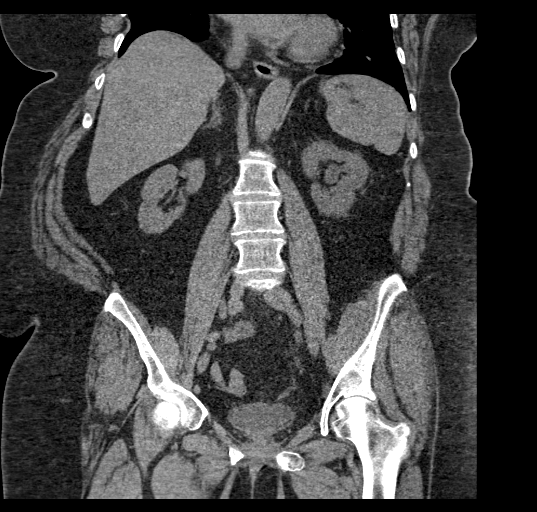

[16 of 46 positions shown; findings below may reference images not displayed]

FINDINGS: Lower chest: No acute abnormality.

Hepatobiliary: No focal liver abnormality is seen. No gallstones,
gallbladder wall thickening, or biliary dilatation.

Pancreas: Unremarkable. No pancreatic ductal dilatation or
surrounding inflammatory changes.

Spleen: Normal in size without focal abnormality.

Adrenals/Urinary Tract: There is a 4 mm calculus in the distal left
ureter. There is mild left-sided hydronephrosis and perinephric fat
stranding. There are additional punctate bilateral renal calculi.
There is scarring in the superior pole the right kidney. The adrenal
glands and bladder are within normal limits.

Stomach/Bowel: Stomach is within normal limits. Appendix appears
normal. No evidence of bowel wall thickening, distention, or
inflammatory changes. There are few scattered colonic diverticula.

Vascular/Lymphatic: Aortic atherosclerosis. No enlarged abdominal or
pelvic lymph nodes.

Reproductive: Status post hysterectomy. No adnexal masses.

Other: No abdominal wall hernia or abnormality. No abdominopelvic
ascites.

Musculoskeletal: No fracture is seen.
IMPRESSION: 1. 4 mm calculus in the distal left ureter with mild obstructive
uropathy.
2. Additional nonobstructing bilateral renal calculi.

## 2023-03-02 ENCOUNTER — Other Ambulatory Visit: Payer: Self-pay | Admitting: Cardiovascular Disease

## 2023-03-24 ENCOUNTER — Other Ambulatory Visit: Payer: Self-pay | Admitting: Cardiovascular Disease

## 2023-03-29 ENCOUNTER — Ambulatory Visit: Payer: Medicare Other | Admitting: Cardiovascular Disease

## 2023-04-12 ENCOUNTER — Other Ambulatory Visit: Payer: Self-pay | Admitting: Cardiovascular Disease

## 2023-06-08 ENCOUNTER — Other Ambulatory Visit: Payer: Self-pay | Admitting: Cardiovascular Disease

## 2023-06-13 ENCOUNTER — Other Ambulatory Visit: Payer: Self-pay | Admitting: Cardiovascular Disease

## 2023-06-29 LAB — LAB REPORT - SCANNED
A1c: 6
EGFR: 69

## 2023-07-25 ENCOUNTER — Encounter: Payer: Self-pay | Admitting: Cardiovascular Disease

## 2023-07-25 ENCOUNTER — Ambulatory Visit: Payer: Medicare Other | Attending: Cardiovascular Disease | Admitting: Cardiovascular Disease

## 2023-07-25 VITALS — BP 132/72 | HR 74 | Ht 62.5 in | Wt 216.2 lb

## 2023-07-25 DIAGNOSIS — E782 Mixed hyperlipidemia: Secondary | ICD-10-CM | POA: Diagnosis not present

## 2023-07-25 DIAGNOSIS — I10 Essential (primary) hypertension: Secondary | ICD-10-CM | POA: Diagnosis not present

## 2023-07-25 DIAGNOSIS — R011 Cardiac murmur, unspecified: Secondary | ICD-10-CM

## 2023-07-25 NOTE — Progress Notes (Signed)
Patient ID: Brandy Oconnell, female   DOB: May 09, 1941, 82 y.o.   MRN: [id number]    Cardiology Office Note    Date:  07/25/2023   ID:  Brandy Oconnell, Brandy Oconnell Nov 15, 1941, MRN [id number]  PCP:  Adrian Prince, MD  Cardiologist:   Thurmon Fair, MD   Chief Complaint  Patient presents with   Hypertension    History of Present Illness:  Brandy Oconnell is a 82 y.o. female with systemic hypertension and hyperlipidemia returns for follow-up.   She has had a good year from a cardiovascular point of view, but had 2 episodes of nephrolithiasis requiring cystoscopy for extraction.  She has calcium oxalate stones and has been trying to follow a strict diet to reduce the likelihood of recurrent stones.  She continues to live independently and takes care of all her household chores without difficulty. The patient specifically denies any chest pain at rest exertion, dyspnea at rest or with exertion, orthopnea, paroxysmal nocturnal dyspnea, syncope, palpitations, focal neurological deficits, intermittent claudication, lower extremity edema, unexplained weight gain, cough, hemoptysis or wheezing.  Most recent labs show good metabolic control, although her LDL is higher than what it was before: Cholesterol 174, triglycerides 168, HDL 43, LDL 97, hemoglobin A1c 6.0%.  She has normal renal function.  She has lost some weight, almost 9 pounds since February 2023, BMI is down to about 39.  She worked in the [employer] for 40 years before retiring.  Has had difficulty identifying an effective treatment for her blood pressure due to side effects with numerous medications. She developed massive uncomfortable leg edema with calcium channel blockers (both amlodipine and verapamil) and did not tolerate beta blockers. She has asthma. She developed a cough with ACE inhibitors. She has no side effects with valsartan, but was intolerant of irbesartan.  Olmesartan was "too strong".  She has a history of calcium  oxalate kidney stones. She also has hyperlipidemia but has been intolerant to multiple statins including daily rosuvastatin, pravastatin and Vytorin. Also did not tolerate Zetia. She seems to tolerate twice weekly rosuvastatin without complaints, but her lipid profile is not quite at target.  She does not have known coronary or peripheral arterial obstructive disease.  Past Medical History:  Diagnosis Date   ALLERGIC RHINITIS    Asthma    Bronchiectasis    Complication of anesthesia    BP bottomed out and they had to put IV back in me.   Degenerative arthritis    Depression    Essential and other specified forms of tremor 09/13/2013   Fibromyalgia    Glaucoma    Bilat   Hyperlipidemia    Hypertension    Hypothyroidism    Kidney stone    Migraine    Migraine without aura, without mention of intractable migraine without mention of status migrainosus 09/13/2013   Obesity    Palpitation    Pelvic fracture (HCC)    PONV (postoperative nausea and vomiting)     Past Surgical History:  Procedure Laterality Date   CYSTOSCOPY WITH RETROGRADE PYELOGRAM, URETEROSCOPY AND STENT PLACEMENT Left 10/04/2017   Procedure: CYSTOSCOPY WITH RETROGRADE PYELOGRAM, URETEROSCOPY AND STENT PLACEMENT;  Surgeon: Crista Elliot, MD;  Location: [facility];  Service: Urology;  Laterality: Left;   CYSTOSCOPY/URETEROSCOPY/HOLMIUM LASER/STENT PLACEMENT Left 11/12/2022   Procedure: CYSTOSCOPY/URETEROSCOPY/HOLMIUM LASER/STENT PLACEMENT;  Surgeon: Rene Paci, MD;  Location: [facility];  Service: Urology;  Laterality: Left;   gyn laparoscopy     LAPAROSCOPIC HYSTERECTOMY  NASAL SEPTUM SURGERY     VESICOVAGINAL FISTULA CLOSURE W/ TAH  1984    Outpatient Medications Prior to Visit  Medication Sig Dispense Refill   acetaminophen (TYLENOL) 500 MG tablet Take 500 mg by mouth in the morning.     aspirin 81 MG tablet Take 81 mg by mouth daily at 6 PM.     cholecalciferol (VITAMIN D3) 25 MCG (1000 UNIT)  tablet Take 5,000 Units by mouth daily at 6 (six) AM.     famotidine (PEPCID) 10 MG tablet Take 10 mg by mouth daily as needed for heartburn.     hydrocortisone 2.5 % cream Apply 1 Application topically See admin instructions. 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     latanoprost (XALATAN) 0.005 % ophthalmic solution Place 1 drop into both eyes at bedtime.     levothyroxine (SYNTHROID, LEVOTHROID) 112 MCG tablet Take 56-112 mcg by mouth See admin instructions. Take 112 mcg by mouth in the morning before breakfast on Sun/Mon/Tues/Wed/Thurs/Fri and 56 mcg on Sat     LORazepam (ATIVAN) 1 MG tablet Take 1 tablet (1 mg total) by mouth 2 (two) times daily as needed for anxiety. (Patient taking differently: Take 1 mg by mouth See admin instructions. Take 1 mg by mouth at bedtime and an additional 1 mg once a day as needed for anxiety) 30 tablet 5   MAGNESIUM GLYCINATE PO Take 500 mg by mouth 2 (two) times daily.     rosuvastatin (CRESTOR) 10 MG tablet TAKE 1 TABLET BY MOUTH DAILY 30 tablet 11   triamcinolone cream (KENALOG) 0.1 % Apply 1 Application topically 2 (two) times daily as needed (to affected areas of the ears).     valsartan (DIOVAN) 320 MG tablet Take 1 tablet (320 mg total) by mouth daily. Pt needs to keep Aug appt for further refills. 30 tablet 0   albuterol (PROVENTIL) (2.5 MG/3ML) 0.083% nebulizer solution Take 3 mLs (2.5 mg total) by nebulization every 6 (six) hours as needed for wheezing or shortness of breath. (Patient not taking: Reported on 07/25/2023) 75 mL 12   albuterol (VENTOLIN HFA) 108 (90 Base) MCG/ACT inhaler Inhale 2 puffs into the lungs every 6 (six) hours as needed for wheezing or shortness of breath. (Patient not taking: Reported on 07/25/2023) 18 g 12   Cholecalciferol (VITAMIN D3) 125 MCG (5000 UT) CAPS Take 5,000 Units by mouth every 3 (three) days. (Patient not taking: Reported on 07/25/2023)      furosemide (LASIX) 40 MG tablet Take 40 mg by mouth daily as needed for edema. (Patient not taking: Reported on 07/25/2023)     HYDROcodone-acetaminophen (NORCO) 5-325 MG tablet Take 1 tablet by mouth every 4 (four) hours as needed for moderate pain. (Patient not taking: Reported on 07/25/2023) 20 tablet 0   ibuprofen (ADVIL) 600 MG tablet Take 1 tablet (600 mg total) by mouth every 6 (six) hours as needed for up to 30 doses for mild pain or moderate pain. (Patient not taking: Reported on 07/25/2023) 30 tablet 0   loratadine (CLARITIN) 10 MG tablet Take 10 mg by mouth daily as needed for allergies.  (Patient not taking: Reported on 07/25/2023)     ondansetron (ZOFRAN) 4 MG tablet Take 1 tablet (4 mg total) by mouth daily as needed for nausea or vomiting. (Patient not taking: Reported on 07/25/2023) 30 tablet 1   ondansetron (ZOFRAN-ODT) 4 MG  disintegrating tablet Take 1 tablet (4 mg total) by mouth every 8 (eight) hours as needed for up to 12 doses for nausea or vomiting. (Patient not taking: Reported on 07/25/2023) 12 tablet 0   oxybutynin (DITROPAN) 5 MG tablet Take 1 tablet (5 mg total) by mouth every 8 (eight) hours as needed for bladder spasms. (Patient not taking: Reported on 07/25/2023) 30 tablet 1   oxyCODONE (ROXICODONE) 5 MG immediate release tablet Take 1 tablet (5 mg total) by mouth every 6 (six) hours as needed for up to 12 doses for severe pain. (Patient not taking: Reported on 07/25/2023) 12 tablet 0   No facility-administered medications prior to visit.     Allergies:   Erythromycin, Latex, Ace inhibitors, Augmentin [amoxicillin-pot clavulanate], Beta adrenergic blockers, Codeine, Doxycycline, Lisinopril, Other, Oxycodone, Statins, Tape, Amlodipine, Pneumovax 23 [pneumococcal vac polyvalent], Sulfonamide derivatives, and Tramadol   Social History   Socioeconomic History   Marital status: Divorced    Spouse name: Not on file   Number of children: 0   Years of education: COLLEGE   Highest  education level: Not on file  Occupational History   Occupation: full time clinical lab tech  Tobacco Use   Smoking status: Never   Smokeless tobacco: Never  Vaping Use   Vaping status: Never Used  Substance and Sexual Activity   Alcohol use: No   Drug use: No   Sexual activity: Not on file  Other Topics Concern   Not on file  Social History Narrative   Patient does not drink caffeine.   Patient is right handed.   Social Determinants of Health   Financial Resource Strain: Not on file  Food Insecurity: Not on file  Transportation Needs: Not on file  Physical Activity: Not on file  Stress: Not on file  Social Connections: Not on file     Family History:  The patient's family history includes Asthma in her mother; Bladder Cancer in her father; COPD in her father; Cancer in her mother; Diabetes in her maternal grandmother, mother, and sister; Headache in her mother; Heart attack in her brother and maternal aunt; Heart disease in her brother, maternal grandmother, mother, and sister; Hypertension in her sister; Kidney cancer in her mother and paternal grandfather; Stroke in her father and mother; Tremor in her maternal grandmother, mother, and sister.   ROS:   Please see the history of present illness.    ROS All other systems are reviewed and are negative  PHYSICAL EXAM:   VS:  BP 132/72   Pulse 74   Ht 5' 2.5" (1.588 m)   Wt 216 lb 3.2 oz (98.1 kg)   SpO2 95%   BMI 38.91 kg/m       General: Alert, oriented x3, no distress, severely obese Head: no evidence of trauma, PERRL, EOMI, no exophtalmos or lid lag, no myxedema, no xanthelasma; normal ears, nose and oropharynx Neck: normal jugular venous pulsations and no hepatojugular reflux; brisk carotid pulses without delay and no carotid bruits Chest: clear to auscultation, no signs of consolidation by percussion or palpation, normal fremitus, symmetrical and full respiratory excursions Cardiovascular: normal position and  quality of the apical impulse, regular rhythm, normal first and distant second heart sounds, 1/6 aortic ejection murmur remains early peaking no diastolic murmurs, rubs or gallops Abdomen: no tenderness or distention, no masses by palpation, no abnormal pulsatility or arterial bruits, normal bowel sounds, no hepatosplenomegaly Extremities: no clubbing, cyanosis or edema; 2+ radial, ulnar and brachial pulses bilaterally; 2+  right femoral, posterior tibial and dorsalis pedis pulses; 2+ left femoral, posterior tibial and dorsalis pedis pulses; no subclavian or femoral bruits Neurological: grossly nonfocal Psych: Normal mood and affect     Wt Readings from Last 3 Encounters:  07/25/23 216 lb 3.2 oz (98.1 kg)  11/12/22 210 lb (95.3 kg)  01/14/22 225 lb 12.8 oz (102.4 kg)      Studies/Labs Reviewed:   EKG:  EKG is ordered today.  It shows sinus rhythm with slightly delayed R wave progression.  Recent Labs: June 04, 2019 Total cholesterol 178, triglycerides 140, HDL 50, LDL 100 Creatinine 0.8, potassium 4.4, normal liver function tests, hemoglobin 13.7, TSH 0.51, hemoglobin A1c 5.7%  June 11, 2020 Total cholesterol 159, triglycerides 176, HDL 43, calculated LDL 81 (she was not fasting at the time).   Hemoglobin A1c 5.9%, creatinine 0.8, potassium 4.1, hemoglobin 14.1  June 05, 2021 Total cholesterol 149, HDL 44, LDL 74, triglycerides 154, Potassium 4.0, ALT 35, TSH 3.43 December 22, 2021 Hemoglobin A1c 5.9%  06/29/2023 Cholesterol 174, triglycerides 168, HDL 43, LDL 97, hemoglobin A1c 6.0% Creatinine 0.8, normal liver function tests, hemoglobin 13.5   ASSESSMENT:    1. Essential hypertension   2. Mixed hyperlipidemia   3. Severe obesity (BMI 35.0-39.9) with comorbidity (HCC)   4. Systolic murmur      PLAN:  In order of problems listed above:  HTN: Good blood pressure control on current medications.Marland Kitchen HLP: Acceptable lipid parameters, but with borderline levels of LDL, HDL  and triglycerides.  I do not think we should change medications, especially considering the difficulty she has had with side effects in the past.  The focus should be on continuing to eat healthy and exercise more.  Congratulated her on weight loss, keep up the good work. Obesity: No longer morbid obese range.  Activity limited by right knee pain and lumbar spine pain, she is doing the best she can. Murmur: Asymptomatic, due to arctic valve sclerosis.  If she develops symptoms of aortic stenosis repeat echocardiogram.    Medication Adjustments/Labs and Tests Ordered: Current medicines are reviewed at length with the patient today.  Concerns regarding medicines are outlined above.  Medication changes, Labs and Tests ordered today are listed in the Patient Instructions below. Patient Instructions  Medication Instructions:  No changes *If you need a refill on your cardiac medications before your next appointment, please call your pharmacy*  Follow-Up: At [facility], you and your health needs are our priority.  As part of our continuing mission to provide you with exceptional heart care, we have created designated Provider Care Teams.  These Care Teams include your primary Cardiologist (physician) and Advanced Practice Providers (APPs -  Physician Assistants and Nurse Practitioners) who all work together to provide you with the care you need, when you need it.  We recommend signing up for the patient portal called "MyChart".  Sign up information is provided on this After Visit Summary.  MyChart is used to connect with patients for Virtual Visits (Telemedicine).  Patients are able to view lab/test results, encounter notes, upcoming appointments, etc.  Non-urgent messages can be sent to your provider as well.   To learn more about what you can do with MyChart, go to [email or web address].    Your next appointment:   1 year(s)  Provider:   Thurmon Fair, MD         Signed, Thurmon Fair, MD  07/25/2023 11:25 AM    [facility]  [address], [place], [place]  [address] Phone: [phone]; Fax: [phone]

## 2023-07-25 NOTE — Patient Instructions (Signed)

## 2023-09-22 ENCOUNTER — Telehealth: Payer: Self-pay | Admitting: Internal Medicine

## 2023-09-22 MED ORDER — AMOXICILLIN 500 MG PO TABS: 500.0000 mg | ORAL_TABLET | Freq: Two times a day (BID) | ORAL | 1 refills | Status: DC

## 2023-09-22 NOTE — Telephone Encounter (Signed)
Sinus infection, asks amoxacillin

## 2023-10-07 ENCOUNTER — Other Ambulatory Visit: Payer: Self-pay | Admitting: Cardiovascular Disease

## 2023-12-30 ENCOUNTER — Other Ambulatory Visit: Payer: Self-pay | Admitting: Endocrinology

## 2023-12-30 ENCOUNTER — Telehealth: Payer: Self-pay | Admitting: Cardiovascular Disease

## 2023-12-30 DIAGNOSIS — Z1231 Encounter for screening mammogram for malignant neoplasm of breast: Secondary | ICD-10-CM

## 2023-12-30 MED ORDER — ROSUVASTATIN CALCIUM 10 MG PO TABS: 10.0000 mg | ORAL_TABLET | Freq: Every day | ORAL | 11 refills | Status: DC

## 2023-12-30 NOTE — Telephone Encounter (Signed)
Pt c/o medication issue:  1. Name of Medication:   rosuvastatin (CRESTOR) 10 MG tablet    2. How are you currently taking this medication (dosage and times per day)? 1 tablet twice a week   3. Are you having a reaction (difficulty breathing--STAT)? no  4. What is your medication issue? Per pt Dr Royann Shivers told her to take medication twice a week. Needs new script sent to pharmacy

## 2023-12-30 NOTE — Telephone Encounter (Signed)
Called patient and sent in refill of crestor

## 2024-02-14 ENCOUNTER — Telehealth: Payer: Self-pay | Admitting: Cardiovascular Disease

## 2024-02-14 MED ORDER — ROSUVASTATIN CALCIUM 10 MG PO TABS: ORAL_TABLET | ORAL | 3 refills | Status: DC

## 2024-02-14 NOTE — Telephone Encounter (Signed)
 Pt c/o medication issue:  1. Name of Medication: rosuvastatin (CRESTOR) 10 MG tablet   2. How are you currently taking this medication (dosage and times per day)? 2x weekly   3. Are you having a reaction (difficulty breathing--STAT)?   4. What is your medication issue? Patient states that after talking with a nurse at [facility], she was advised that it is listed she takes the medication daily, and not x2 weekly like advised by Dr. Royann Shivers. She wants to make Dr. Royann Shivers aware she has never, since prescribed, taken the medication daily.

## 2024-02-14 NOTE — Telephone Encounter (Signed)
 Spoke to patient she stated she takes Crestor 10 mg 2 x week on Tues and Thurs.Advised I will correct in her chart.

## 2024-02-15 ENCOUNTER — Ambulatory Visit: Payer: Medicare Other

## 2024-03-01 ENCOUNTER — Ambulatory Visit
Admission: RE | Admit: 2024-03-01 | Discharge: 2024-03-01 | Disposition: A | Discharge status: Home / Self Care | Source: Ambulatory Visit | Attending: Endocrinology | Admitting: Endocrinology

## 2024-03-01 DIAGNOSIS — Z1231 Encounter for screening mammogram for malignant neoplasm of breast: Secondary | ICD-10-CM

## 2024-05-09 ENCOUNTER — Other Ambulatory Visit: Payer: Self-pay | Admitting: Cardiovascular Disease

## 2024-08-13 ENCOUNTER — Other Ambulatory Visit: Payer: Self-pay | Admitting: Cardiovascular Disease

## 2024-08-24 ENCOUNTER — Telehealth: Payer: Self-pay | Admitting: Internal Medicine

## 2024-08-24 MED ORDER — COVID-19 MRNA VAC-TRIS(PFIZER) 30 MCG/0.3ML IM SUSY: 0.3000 mL | PREFILLED_SYRINGE | Freq: Once | INTRAMUSCULAR | 0 refills | Status: AC

## 2024-08-24 NOTE — Telephone Encounter (Signed)
 Asks covid vax update- [facility]

## 2024-09-17 ENCOUNTER — Telehealth: Payer: Self-pay | Admitting: Internal Medicine

## 2024-09-17 ENCOUNTER — Other Ambulatory Visit: Payer: Self-pay | Admitting: Internal Medicine

## 2024-09-17 MED ORDER — ALBUTEROL SULFATE (2.5 MG/3ML) 0.083% IN NEBU: 2.5000 mg | INHALATION_SOLUTION | Freq: Four times a day (QID) | RESPIRATORY_TRACT | 12 refills | Status: AC | PRN

## 2024-09-17 MED ORDER — AMOXICILLIN 500 MG PO TABS: 500.0000 mg | ORAL_TABLET | Freq: Two times a day (BID) | ORAL | 1 refills | Status: AC

## 2024-09-17 MED ORDER — ALBUTEROL SULFATE HFA 108 (90 BASE) MCG/ACT IN AERS: 2.0000 | INHALATION_SPRAY | Freq: Four times a day (QID) | RESPIRATORY_TRACT | 12 refills | Status: AC | PRN

## 2024-09-17 NOTE — Telephone Encounter (Signed)
 Mild intermittent asthma, currently uncomplicated. She has compressor nebulizer machine, but asks refill albuterol  neb solution and also albuterol  hfa rescue inhaler, since will be retiring.

## 2024-09-19 ENCOUNTER — Other Ambulatory Visit: Payer: Self-pay | Admitting: Cardiovascular Disease

## 2024-09-24 ENCOUNTER — Other Ambulatory Visit: Payer: Self-pay | Admitting: Cardiovascular Disease

## 2024-11-15 ENCOUNTER — Other Ambulatory Visit: Payer: Self-pay | Admitting: Urology

## 2024-11-19 ENCOUNTER — Encounter (HOSPITAL_COMMUNITY): Payer: Self-pay | Admitting: Urology

## 2024-11-20 ENCOUNTER — Telehealth: Payer: Self-pay | Admitting: Cardiovascular Disease

## 2024-11-20 ENCOUNTER — Encounter (HOSPITAL_COMMUNITY): Payer: Self-pay | Admitting: Urology

## 2024-11-20 ENCOUNTER — Other Ambulatory Visit: Payer: Self-pay

## 2024-11-20 NOTE — Telephone Encounter (Signed)
 Spoke to patient she stated she is having kidney stone surgery tomorrow.Stated when she was talking to pre op nurse at hospital she told her she noticed she had a ultrasound of heart in 2024.Stated she was calling to see if she had a ultrasound.She never remembers having one done.After reviewing chart.Advised she never had a echo.

## 2024-11-20 NOTE — Progress Notes (Addendum)
 SDW call  Patient was given pre-op instructions over the phone. Patient verbalized understanding of instructions provided.  Denies any SOB, fever or cough   PCP - Dr. Garnette Gutting Cardiologist - Dr. Jerel Croitoru Pulmonary:    PPM/ICD - denies Device Orders - na Rep Notified - na   Chest x-ray - na EKG -  DOS, 11/21/2024 Stress Test -09/20/1995 ECHO - denies Cardiac Cath - 01/09/1998  Sleep Study/sleep apnea/CPAP: denies  Non-diabetic   Blood Thinner Instructions: denies Aspirin Instructions:states has taken in 6 months  ERAS Protcol - NPO   Anesthesia review: Yes. HTN, cardiac history  Your procedure is scheduled on Wednesday November 21, 2024  Report to [facility] Main Entrance A at 0630   A.M., then check in with the Admitting office.  Call this number if you have problems the morning of surgery:  [phone]   If you have any questions prior to your surgery date call [phone]: Open Monday-Friday 8am-4pm If you experience any cold or flu symptoms such as cough, fever, chills, shortness of breath, etc. between now and your scheduled surgery, please notify us  at the above number    Remember:  Do not eat or drink after midnight the night before your surgery  Take these medicines the morning of surgery with A SIP OF WATER:  synthroid  As needed: Tylenol , albuterol  neb/inhaler, ativan , zofran   As of today, STOP taking any Aspirin (unless otherwise instructed by your surgeon) Aleve, Naproxen, Ibuprofen , Motrin , Advil , Goody's, BC's, all herbal medications, fish oil, and all vitamins.

## 2024-11-20 NOTE — Telephone Encounter (Signed)
 Patient calling in to speak to the nurse about her records of having a ultrasound done in 2024. She believes that someone else info in tied to her chart. Please advise

## 2024-11-21 ENCOUNTER — Encounter (HOSPITAL_COMMUNITY): Payer: Self-pay | Admitting: Urology

## 2024-11-21 ENCOUNTER — Ambulatory Visit (HOSPITAL_COMMUNITY)
Admission: RE | Admit: 2024-11-21 | Discharge: 2024-11-21 | Disposition: A | Discharge status: Home / Self Care | Attending: Urology | Admitting: Urology

## 2024-11-21 ENCOUNTER — Ambulatory Visit: Admission: RE | Disposition: A | Payer: Self-pay | Attending: Urology

## 2024-11-21 ENCOUNTER — Ambulatory Visit (HOSPITAL_COMMUNITY): Payer: Self-pay | Admitting: Physician Assistant

## 2024-11-21 ENCOUNTER — Ambulatory Visit (HOSPITAL_COMMUNITY)

## 2024-11-21 HISTORY — PX: CYSTOSCOPY/URETEROSCOPY/HOLMIUM LASER/STENT PLACEMENT: SHX6546

## 2024-11-21 LAB — BASIC METABOLIC PANEL WITH GFR
Anion gap: 11 (ref 5–15)
BUN: 10 mg/dL (ref 8–23)
CO2: 24 mmol/L (ref 22–32)
Calcium: 8.4 mg/dL — ABNORMAL LOW (ref 8.9–10.3)
Chloride: 103 mmol/L (ref 98–111)
Creatinine, Ser: 0.83 mg/dL (ref 0.44–1.00)
GFR, Estimated: >60 mL/min (ref >60)
Glucose, Bld: 111 mg/dL — ABNORMAL HIGH (ref 70–99)
Potassium: 3.6 mmol/L (ref 3.5–5.1)
Sodium: 138 mmol/L (ref 135–145)

## 2024-11-21 LAB — CBC
HCT: 41.1 % (ref 36.0–46.0)
Hemoglobin: 13.6 g/dL (ref 12.0–15.0)
MCH: 29.2 pg (ref 26.0–34.0)
MCHC: 33.1 g/dL (ref 30.0–36.0)
MCV: 88.2 fL (ref 80.0–100.0)
Platelets: 240 K/uL (ref 150–400)
RBC: 4.66 MIL/uL (ref 3.87–5.11)
RDW: 13.4 % (ref 11.5–15.5)
WBC: 7.5 K/uL (ref 4.0–10.5)
nRBC: 0 % (ref 0.0–0.2)

## 2024-11-21 SURGERY — CYSTOSCOPY/URETEROSCOPY/HOLMIUM LASER/STENT PLACEMENT
Anesthesia: General | Site: Ureter | Laterality: Left

## 2024-11-21 MED ORDER — FENTANYL CITRATE (PF) 250 MCG/5ML IJ SOLN
INTRAMUSCULAR | Status: DC | PRN
  Administered 2024-11-21: 50 ug via INTRAVENOUS
  Administered 2024-11-21 (×2): 25 ug via INTRAVENOUS

## 2024-11-21 MED ORDER — ONDANSETRON HCL 4 MG/2ML IJ SOLN
INTRAMUSCULAR | Status: AC
  Filled 2024-11-21: qty 2

## 2024-11-21 MED ORDER — ONDANSETRON HCL 4 MG/2ML IJ SOLN: 4.0000 mg | Freq: Once | INTRAMUSCULAR | Status: DC | PRN

## 2024-11-21 MED ORDER — CEFAZOLIN SODIUM-DEXTROSE 2-4 GM/100ML-% IV SOLN
2.0000 g | INTRAVENOUS | Status: AC
  Administered 2024-11-21: 2 g via INTRAVENOUS
  Filled 2024-11-21: qty 100

## 2024-11-21 MED ORDER — AMISULPRIDE (ANTIEMETIC) 5 MG/2ML IV SOLN: 10.0000 mg | Freq: Once | INTRAVENOUS | Status: DC | PRN

## 2024-11-21 MED ORDER — ACETAMINOPHEN 500 MG PO TABS
1000.0000 mg | ORAL_TABLET | Freq: Once | ORAL | Status: AC
  Administered 2024-11-21: 1000 mg via ORAL
  Filled 2024-11-21: qty 2

## 2024-11-21 MED ORDER — ONDANSETRON HCL 4 MG/2ML IJ SOLN
INTRAMUSCULAR | Status: DC | PRN
  Administered 2024-11-21: 4 mg via INTRAVENOUS

## 2024-11-21 MED ORDER — SODIUM CHLORIDE 0.9 % IR SOLN
Status: DC | PRN
  Administered 2024-11-21: 3000 mL

## 2024-11-21 MED ORDER — FENTANYL CITRATE (PF) 100 MCG/2ML IJ SOLN: 25.0000 ug | INTRAMUSCULAR | Status: DC | PRN

## 2024-11-21 MED ORDER — FENTANYL CITRATE (PF) 100 MCG/2ML IJ SOLN
INTRAMUSCULAR | Status: AC
  Filled 2024-11-21: qty 2

## 2024-11-21 MED ORDER — STERILE WATER FOR IRRIGATION IR SOLN
Status: DC | PRN
  Administered 2024-11-21: 1000 mL

## 2024-11-21 MED ORDER — ORAL CARE MOUTH RINSE: 15.0000 mL | Freq: Once | OROMUCOSAL | Status: AC

## 2024-11-21 MED ORDER — CIPROFLOXACIN HCL 500 MG PO TABS: 500.0000 mg | ORAL_TABLET | Freq: Two times a day (BID) | ORAL | 0 refills | Status: AC

## 2024-11-21 MED ORDER — AMISULPRIDE (ANTIEMETIC) 5 MG/2ML IV SOLN
INTRAVENOUS | Status: DC | PRN
  Administered 2024-11-21: 5 mg via INTRAVENOUS

## 2024-11-21 MED ORDER — PROPOFOL 10 MG/ML IV BOLUS
INTRAVENOUS | Status: AC
  Filled 2024-11-21: qty 20

## 2024-11-21 MED ORDER — DEXAMETHASONE SOD PHOSPHATE PF 10 MG/ML IJ SOLN
INTRAMUSCULAR | Status: DC | PRN
  Administered 2024-11-21: 10 mg via INTRAVENOUS

## 2024-11-21 MED ORDER — AMISULPRIDE (ANTIEMETIC) 5 MG/2ML IV SOLN
INTRAVENOUS | Status: AC
  Filled 2024-11-21: qty 2

## 2024-11-21 MED ORDER — LIDOCAINE HCL URETHRAL/MUCOSAL 2 % EX GEL
CUTANEOUS | Status: AC
  Filled 2024-11-21: qty 11

## 2024-11-21 MED ORDER — IOHEXOL 300 MG/ML  SOLN
INTRAMUSCULAR | Status: DC | PRN
  Administered 2024-11-21: 10 mL

## 2024-11-21 MED ORDER — SCOPOLAMINE 1 MG/3DAYS TD PT72
MEDICATED_PATCH | TRANSDERMAL | Status: AC
  Filled 2024-11-21: qty 1

## 2024-11-21 MED ORDER — DEXMEDETOMIDINE HCL IN NACL 80 MCG/20ML IV SOLN
INTRAVENOUS | Status: DC | PRN
  Administered 2024-11-21 (×2): 4 ug via INTRAVENOUS

## 2024-11-21 MED ORDER — PHENYLEPHRINE 80 MCG/ML (10ML) SYRINGE FOR IV PUSH (FOR BLOOD PRESSURE SUPPORT)
PREFILLED_SYRINGE | INTRAVENOUS | Status: DC | PRN
  Administered 2024-11-21: 80 ug via INTRAVENOUS

## 2024-11-21 MED ORDER — KETOROLAC TROMETHAMINE 15 MG/ML IJ SOLN: 15.0000 mg | Freq: Once | INTRAMUSCULAR | Status: DC | PRN

## 2024-11-21 MED ORDER — PROPOFOL 10 MG/ML IV BOLUS
INTRAVENOUS | Status: DC | PRN
  Administered 2024-11-21: 50 mg via INTRAVENOUS
  Administered 2024-11-21: 80 ug/kg/min via INTRAVENOUS
  Administered 2024-11-21: 100 mg via INTRAVENOUS

## 2024-11-21 MED ORDER — LACTATED RINGERS IV SOLN: INTRAVENOUS | Status: DC

## 2024-11-21 MED ORDER — SCOPOLAMINE 1 MG/3DAYS TD PT72
MEDICATED_PATCH | TRANSDERMAL | Status: DC | PRN
  Administered 2024-11-21: 1 via TRANSDERMAL

## 2024-11-21 MED ORDER — LIDOCAINE 2% (20 MG/ML) 5 ML SYRINGE
INTRAMUSCULAR | Status: DC | PRN
  Administered 2024-11-21: 60 mg via INTRAVENOUS

## 2024-11-21 MED ORDER — LIDOCAINE 2% (20 MG/ML) 5 ML SYRINGE
INTRAMUSCULAR | Status: AC
  Filled 2024-11-21: qty 5

## 2024-11-21 MED ORDER — CHLORHEXIDINE GLUCONATE 0.12 % MT SOLN
15.0000 mL | Freq: Once | OROMUCOSAL | Status: AC
  Administered 2024-11-21: 15 mL via OROMUCOSAL
  Filled 2024-11-21: qty 15

## 2024-11-21 MED ORDER — TRAMADOL HCL 50 MG PO TABS: 50.0000 mg | ORAL_TABLET | Freq: Four times a day (QID) | ORAL | 0 refills | Status: AC | PRN

## 2024-11-21 MED FILL — Lidocaine HCl Local Soln Prefilled Syringe 100 MG/5ML (2%): INTRAMUSCULAR | Qty: 2 | Status: AC

## 2024-11-21 SURGICAL SUPPLY — 13 items
BAG DRAIN URO-CYSTO SKYTR STRL (DRAIN) ×1 IMPLANT
FIBER LASER FLEXIVA PULSE EA (MISCELLANEOUS) IMPLANT
GLOVE BIO SURGEON STRL SZ7.5 (GLOVE) ×1 IMPLANT
GOWN STRL REUS W/ TWL XL LVL3 (GOWN DISPOSABLE) ×1 IMPLANT
GUIDEWIRE STR DUAL SENSOR (WIRE) ×1 IMPLANT
KIT TURNOVER KIT B (KITS) ×1 IMPLANT
MANIFOLD NEPTUNE II (INSTRUMENTS) ×1 IMPLANT
NS IRRIG 500ML POUR BTL (IV SOLUTION) ×1 IMPLANT
PACK CYSTO (CUSTOM PROCEDURE TRAY) ×1 IMPLANT
SLEEVE SCD COMPRESS KNEE MED (STOCKING) ×1 IMPLANT
SOL .9 NS 3000ML IRR UROMATIC (IV SOLUTION) ×1 IMPLANT
STENT URET 6FRX24 CONTOUR (STENTS) IMPLANT
TUBING UROLOGY SET (TUBING) ×1 IMPLANT

## 2024-11-21 NOTE — Anesthesia Preprocedure Evaluation (Addendum)
 Anesthesia Evaluation  Patient identified by MRN, date of birth, ID band Patient awake    Reviewed: Allergy & Precautions, NPO status , Patient's Chart, lab work & pertinent test results  History of Anesthesia Complications (+) PONV and history of anesthetic complications  Airway Mallampati: III  TM Distance: >3 FB Neck ROM: Full    Dental no notable dental hx.    Pulmonary asthma    Pulmonary exam normal        Cardiovascular hypertension, Pt. on medications Normal cardiovascular exam     Neuro/Psych negative neurological ROS     GI/Hepatic negative GI ROS, Neg liver ROS,,,  Endo/Other  Hypothyroidism    Renal/GU Renal disease     Musculoskeletal  (+) Arthritis ,  Fibromyalgia -  Abdominal  (+) + obese  Peds  Hematology negative hematology ROS (+)   Anesthesia Other Findings LEFT URETERAL STONE  Reproductive/Obstetrics                              Anesthesia Physical Anesthesia Plan  ASA: 3  Anesthesia Plan: General   Post-op Pain Management:    Induction: Intravenous  PONV Risk Score and Plan: 4 or greater and Ondansetron , Dexamethasone , Amisulpride , Treatment may vary due to age or medical condition, Propofol  infusion and Scopolamine  patch - Pre-op  Airway Management Planned: LMA  Additional Equipment:   Intra-op Plan:   Post-operative Plan: Extubation in OR  Informed Consent: I have reviewed the patients History and Physical, chart, labs and discussed the procedure including the risks, benefits and alternatives for the proposed anesthesia with the patient or authorized representative who has indicated his/her understanding and acceptance.     Dental advisory given  Plan Discussed with: CRNA  Anesthesia Plan Comments: (Risks of scopolamine  discussed at length. Will order per patient request and against medical advice.)         Anesthesia Quick  Evaluation

## 2024-11-21 NOTE — Op Note (Signed)
 Operative Note  Preoperative diagnosis:  1.  Left ureteral calculus  Postoperative diagnosis: 1.  Left ureteral calculus  Procedure(s): 1.  Cystoscopy with left retrograde pyelogram, left ureteroscopy with laser lithotripsy, ureteral stent placement  Surgeon: Sherwood Edison, MD  Assistants: None  Anesthesia: General  Complications: None immediate  EBL: Minimal  Specimens: 1.  None  Drains/Catheters: 1.  6 x 24 double-J ureteral stent  Intraoperative findings: 1.  Normal urethra and bladder 2.  Left distal ureteral calculus fragmented and extracted. 3.  Left retrograde pyelogram revealed moderate to severe hydronephrosis with no filling defect in the kidney after treatment of the stone  Indication: 83 year old female with a left distal ureteral calculus presents for ureteroscopy  Description of procedure:  The patient was identified and consent was obtained.  The patient was taken to the operating room and placed in the supine position.  The patient was placed under general anesthesia.  Perioperative antibiotics were administered.  The patient was placed in dorsal lithotomy.  Patient was prepped and draped in a standard sterile fashion and a timeout was performed.  A 21 French rigid cystoscope was advanced into the urethra and into the bladder.  Complete cystoscopy was performed with findings noted above.  Left ureter was cannulated with a sensor wire which was advanced up to the kidney under fluoroscopic guidance.  Semirigid ureteroscopy was performed alongside the wire up to the stone which was laser fragmented to tiny fragments and then I used the scope to wash the fragments out.  Once the ureter was clear, I advanced the scope up to the renal pelvis and no other calculi were seen.  Retrograde pyelogram was performed through the scope with findings noted above.  I withdrew the scope visualizing the ureter upon removal.  There was some narrowing in the distal ureter beyond the level  of the stone but otherwise no ureteral abnormality.  There was no injury noted.  I backloaded the wire onto a rigid cystoscope and advanced that into the bladder followed by routine placement of a 6 x 24 double-J ureteral stent.  Fluoroscopy confirmed proximal placement and direct visualization confirmed a good coil within the bladder.  I drained the bladder withdrew the scope.  Patient tolerated procedure well and was stable postoperatively.  Plan: Follow-up in 1 week for stent removal

## 2024-11-21 NOTE — Transfer of Care (Signed)
 Immediate Anesthesia Transfer of Care Note  Patient: Brandy Oconnell  Procedure(s) Performed: CYSTOSCOPY/URETEROSCOPY/HOLMIUM LASER/STENT PLACEMENT (Left: Ureter)  Patient Location: PACU  Anesthesia Type:General  Level of Consciousness: awake  Airway & Oxygen Therapy: Patient Spontanous Breathing and Patient connected to face mask oxygen  Post-op Assessment: Report given to RN and Post -op Vital signs reviewed and stable  Post vital signs: Reviewed and stable  Last Vitals:  Vitals Value Taken Time  BP 129/66 11/21/24 09:25  Temp 36.2 C 11/21/24 09:25  Pulse 73 11/21/24 09:27  Resp 18 11/21/24 09:27  SpO2 98 % 11/21/24 09:27  Vitals shown include unfiled device data.  Last Pain:  Vitals:   11/21/24 0925  TempSrc:   PainSc: Asleep         Complications: No notable events documented.

## 2024-11-21 NOTE — Anesthesia Procedure Notes (Signed)
 Procedure Name: LMA Insertion Date/Time: 11/21/2024 8:51 AM  Performed by: Julien Manus, CRNAPre-anesthesia Checklist: Patient identified, Emergency Drugs available, Suction available and Patient being monitored Patient Re-evaluated:Patient Re-evaluated prior to induction Oxygen Delivery Method: Circle System Utilized Preoxygenation: Pre-oxygenation with 100% oxygen Induction Type: IV induction Ventilation: Mask ventilation without difficulty LMA: LMA inserted LMA Size: 4.0 Number of attempts: 2 Airway Equipment and Method: Bite block Placement Confirmation: positive ETCO2 Tube secured with: Tape Dental Injury: Teeth and Oropharynx as per pre-operative assessment

## 2024-11-21 NOTE — H&P (Signed)
 H&P  Chief Complaint: left ureteral stone  History of Present Illness: 83 YO F with left ureteral stone here for URS/LL/stent  Past Medical History:  Diagnosis Date   ALLERGIC RHINITIS    Asthma    Bronchiectasis    Complication of anesthesia    BP bottomed out and they had to put IV back in me.   Degenerative arthritis    Depression    Essential and other specified forms of tremor 09/13/2013   Fibromyalgia    Glaucoma    Bilatera -prim open angle   Hyperlipidemia    Hypertension    Hypothyroidism    Kidney stone    Migraine    Migraine without aura, without mention of intractable migraine without mention of status migrainosus 09/13/2013   Obesity    Palpitation    Pelvic fracture (HCC)    PONV (postoperative nausea and vomiting)    Past Surgical History:  Procedure Laterality Date   CYSTOSCOPY WITH RETROGRADE PYELOGRAM, URETEROSCOPY AND STENT PLACEMENT Left 10/04/2017   Procedure: CYSTOSCOPY WITH RETROGRADE PYELOGRAM, URETEROSCOPY AND STENT PLACEMENT;  Surgeon: Carolee Sherwood JONETTA DOUGLAS, MD;  Location: [facility];  Service: Urology;  Laterality: Left;   CYSTOSCOPY/URETEROSCOPY/HOLMIUM LASER/STENT PLACEMENT Left 11/12/2022   Procedure: CYSTOSCOPY/URETEROSCOPY/HOLMIUM LASER/STENT PLACEMENT;  Surgeon: Devere Lonni Righter, MD;  Location: [facility];  Service: Urology;  Laterality: Left;   gyn laparoscopy     LAPAROSCOPIC HYSTERECTOMY     NASAL SEPTUM SURGERY     VESICOVAGINAL FISTULA CLOSURE W/ TAH  1984    Home Medications:  Medications Prior to Admission  Medication Sig Dispense Refill Last Dose/Taking   acetaminophen  (TYLENOL ) 500 MG tablet Take 500 mg by mouth every 6 (six) hours as needed for moderate pain (pain score 4-6).   11/20/2024   albuterol  (PROVENTIL ) (2.5 MG/3ML) 0.083% nebulizer solution Take 3 mLs (2.5 mg total) by nebulization every 6 (six) hours as needed for wheezing or shortness of breath. 75 mL 12 Past Month   albuterol  (VENTOLIN  HFA) 108 (90 Base) MCG/ACT  inhaler Inhale 2 puffs into the lungs every 6 (six) hours as needed for wheezing or shortness of breath. 18 g 12 Past Month   Cholecalciferol (VITAMIN D3) 125 MCG (5000 UT) TABS Take 5,000 Units by mouth 2 (two) times a week.   Past Month   furosemide (LASIX) 40 MG tablet Take 40 mg by mouth daily as needed for edema.   Past Month   ketorolac  (TORADOL ) 10 MG tablet Take 10 mg by mouth every 8 (eight) hours as needed for moderate pain (pain score 4-6).   Past Week   latanoprost (XALATAN) 0.005 % ophthalmic solution Place 1 drop into both eyes at bedtime.   11/20/2024   levothyroxine (SYNTHROID) 100 MCG tablet Take 100 mcg by mouth daily before breakfast.   11/21/2024 at  5:00 AM   LORazepam  (ATIVAN ) 1 MG tablet Take 1 tablet (1 mg total) by mouth 2 (two) times daily as needed for anxiety. (Patient taking differently: Take 1 mg by mouth See admin instructions. Take 1 mg by mouth at bedtime and an additional 1 mg once a day as needed for anxiety) 30 tablet 5 11/20/2024   MAGNESIUM GLYCINATE PO Take 500 mg by mouth 2 (two) times daily.   Past Week   ondansetron  (ZOFRAN -ODT) 4 MG disintegrating tablet Take 1 tablet (4 mg total) by mouth every 8 (eight) hours as needed for up to 12 doses for nausea or vomiting. 12 tablet 0 Past Week   Potassium  Citrate 15 MEQ (1620 MG) TBCR Take 1 tablet by mouth 2 (two) times daily.   Past Week   rosuvastatin  (CRESTOR ) 10 MG tablet TAKE 1 TABLET BY MOUTH TWICE  WEEKLY ON TUESDAY AND THURSDAY 24 tablet 0 11/20/2024   triamcinolone cream (KENALOG) 0.1 % Apply 1 Application topically 3 (three) times a week.   Past Week   valsartan  (DIOVAN ) 320 MG tablet TAKE 1 TABLET BY MOUTH DAILY (Patient taking differently: Take 320 mg by mouth at bedtime.) 90 tablet 0 11/20/2024   amoxicillin  (AMOXIL ) 500 MG tablet Take 1 tablet (500 mg total) by mouth 2 (two) times daily. (Patient not taking: Reported on 11/16/2024) 20 tablet 1 Unknown   aspirin 81 MG tablet Take 81 mg by mouth daily. (Patient  not taking: Reported on 11/20/2024)   More than a month   oxybutynin  (DITROPAN ) 5 MG tablet Take 1 tablet (5 mg total) by mouth every 8 (eight) hours as needed for bladder spasms. (Patient not taking: Reported on 07/25/2023) 30 tablet 1 Unknown   Allergies:  Allergies  Allergen Reactions   Erythromycin     I felt like I was having a heart attack   Latex Shortness Of Breath and Cough   Ace Inhibitors Cough   Augmentin [Amoxicillin -Pot Clavulanate] Nausea And Vomiting   Beta Adrenergic Blockers Swelling, Other (See Comments) and Cough    Ankles swell   Codeine Nausea And Vomiting   Doxycycline  Nausea And Vomiting   Lisinopril Other (See Comments) and Cough    Caugh eventually turned into bronchitis   Other Nausea And Vomiting and Other (See Comments)    Unnamed anesthesia- Nausea and vomiting (anti-nausea meds needed)   Oxycodone  Nausea And Vomiting   Statins Other (See Comments)    Leg pain   Tape Other (See Comments)    Blisters- Prefers paper tape ONLY   Amlodipine Swelling   Pneumovax 23 [Pneumococcal Vac Polyvalent] Rash and Other (See Comments)    Large local reaction-rash (2008)   Sulfonamide Derivatives Hives and Rash   Tramadol Other (See Comments)    Nightmares    Family History  Problem Relation Age of Onset   Heart disease Brother    Heart disease Mother    Asthma Mother    Kidney cancer Mother    Cancer Mother    Diabetes Mother    Stroke Mother    Tremor Mother    Headache Mother    Heart disease Sister    Diabetes Sister    Tremor Sister    COPD Father    Stroke Father    Bladder Cancer Father    Heart disease Maternal Grandmother    Diabetes Maternal Grandmother    Tremor Maternal Grandmother    Heart attack Brother    Hypertension Sister    Heart attack Maternal Aunt    Kidney cancer Paternal Grandfather    Social History:  reports that she has never smoked. She has never used smokeless tobacco. She reports that she does not drink alcohol and  does not use drugs.  ROS: A complete review of systems was performed.  All systems are negative except for pertinent findings as noted. ROS   Physical Exam:  Vital signs in last 24 hours: Temp:  [97.6 F (36.4 C)] 97.6 F (36.4 C) (12/10 0655) Pulse Rate:  [71] 71 (12/10 0655) Resp:  [17] 17 (12/10 0655) BP: (173)/(60) 173/60 (12/10 0655) SpO2:  [94 %] 94 % (12/10 0655) Weight:  [95.3 kg] 95.3 kg (  12/10 0655) General:  Alert and oriented, No acute distress HEENT: Normocephalic, atraumatic Neck: No JVD or lymphadenopathy Cardiovascular: Regular rate and rhythm Lungs: Regular rate and effort Abdomen: Soft, nontender, nondistended, no abdominal masses Back: No CVA tenderness Extremities: No edema Neurologic: Grossly intact  Laboratory Data:  Results for orders placed or performed during the hospital encounter of 11/21/24 (from the past 24 hours)  Basic metabolic panel per protocol     Status: Abnormal   Collection Time: 11/21/24  7:13 AM  Result Value Ref Range   Sodium 138 135 - 145 mmol/L   Potassium 3.6 3.5 - 5.1 mmol/L   Chloride 103 98 - 111 mmol/L   CO2 24 22 - 32 mmol/L   Glucose, Bld 111 (H) 70 - 99 mg/dL   BUN 10 8 - 23 mg/dL   Creatinine, Ser 9.16 0.44 - 1.00 mg/dL   Calcium  8.4 (L) 8.9 - 10.3 mg/dL   GFR, Estimated >39 >39 mL/min   Anion gap 11 5 - 15  CBC per protocol     Status: None   Collection Time: 11/21/24  7:13 AM  Result Value Ref Range   WBC 7.5 4.0 - 10.5 K/uL   RBC 4.66 3.87 - 5.11 MIL/uL   Hemoglobin 13.6 12.0 - 15.0 g/dL   HCT 58.8 63.9 - 53.9 %   MCV 88.2 80.0 - 100.0 fL   MCH 29.2 26.0 - 34.0 pg   MCHC 33.1 30.0 - 36.0 g/dL   RDW 86.5 88.4 - 84.4 %   Platelets 240 150 - 400 K/uL   nRBC 0.0 0.0 - 0.2 %   No results found for this or any previous visit (from the past 240 hours). Creatinine: Recent Labs    11/21/24 0713  CREATININE 0.83    Impression/Assessment:  Left ureteral stone  Plan:  Cysto w/ left URS/LL/stent  Sherwood JONETTA Edison, III 11/21/2024, 8:29 AM

## 2024-11-21 NOTE — Discharge Instructions (Signed)

## 2024-11-22 ENCOUNTER — Encounter (HOSPITAL_COMMUNITY): Payer: Self-pay | Admitting: Urology

## 2024-11-22 NOTE — Anesthesia Postprocedure Evaluation (Signed)
 Anesthesia Post Note  Patient: LAQUILLA DAULT  Procedure(s) Performed: CYSTOSCOPY/URETEROSCOPY/HOLMIUM LASER/STENT PLACEMENT (Left: Ureter)     Patient location during evaluation: PACU Anesthesia Type: General Level of consciousness: awake Pain management: pain level controlled Vital Signs Assessment: post-procedure vital signs reviewed and stable Respiratory status: spontaneous breathing, nonlabored ventilation and respiratory function stable Cardiovascular status: blood pressure returned to baseline and stable Postop Assessment: no apparent nausea or vomiting Anesthetic complications: no   No notable events documented.  Last Vitals:  Vitals:   11/21/24 0945 11/21/24 0955  BP: 133/66 (!) 149/69  Pulse: 64 60  Resp: 13 11  Temp:  (!) 36.3 C  SpO2: 93% 95%    Last Pain:  Vitals:   11/21/24 0925  TempSrc:   PainSc: Asleep                 Lashawn Bromwell P Luken Shadowens

## 2024-11-28 ENCOUNTER — Encounter: Payer: Self-pay | Admitting: Cardiovascular Disease

## 2024-11-28 ENCOUNTER — Ambulatory Visit: Attending: Cardiovascular Disease | Admitting: Cardiovascular Disease

## 2024-11-28 VITALS — BP 150/71 | HR 73 | Ht 62.0 in | Wt 210.0 lb

## 2024-11-28 DIAGNOSIS — I10 Essential (primary) hypertension: Secondary | ICD-10-CM | POA: Diagnosis not present

## 2024-11-28 DIAGNOSIS — R011 Cardiac murmur, unspecified: Secondary | ICD-10-CM

## 2024-11-28 DIAGNOSIS — E782 Mixed hyperlipidemia: Secondary | ICD-10-CM

## 2024-11-28 NOTE — Patient Instructions (Signed)

## 2024-11-28 NOTE — Progress Notes (Signed)
 Patient ID: Brandy Oconnell, female   DOB: November 06, 1941, 83 y.o.   MRN: [id number]    Cardiology Office Note    Date:  12/07/2024   ID:  Annebelle, Bostic 1941-10-14, MRN [id number]  PCP:  Nichole Senior, MD  Cardiologist:   Jerel Balding, MD   No chief complaint on file.   History of Present Illness:  Brandy Oconnell is a 83 y.o. female with systemic hypertension and hyperlipidemia returns for follow-up.  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  She has had no cardiovascular problems but continues to have recurrent problems with nephrolithiasis (calcium  oxalate).  She has had a fourth kidney stone in the left kidney where she seems to have recurrent problems at the ureter--bladder junction.  She has a stent in place.  Her urologist is Dr. Carolee.  She takes furosemide infrequently for lower extremity edema.  She has not taken any in over a month.  She also had shingles on her left hip area.  The patient specifically denies any chest pain at rest or with exertion, dyspnea at rest or with exertion, orthopnea, paroxysmal nocturnal dyspnea, syncope, palpitations, focal neurological deficits, intermittent claudication, severe lower extremity edema, unexplained weight gain, cough, hemoptysis or wheezing.   Metabolic control is good with an LDL cholesterol of 71, HDL 46 triglycerides and hemoglobin A1c of 6.0%.  She has normal renal function with a recent creatinine of 0.83.  She has lost about 6 pounds since last year, but BMI remains around 38-39.  Her blood pressure is often a little high in the clinic, but at home her systolic blood pressure is consistently in the 120s-130s and her diastolic blood pressure is in the 60s-low 70s   Has had difficulty identifying an effective treatment for her blood pressure due to side effects with numerous medications. She developed massive uncomfortable leg edema with calcium  channel blockers (both amlodipine and verapamil) and  did not tolerate beta blockers. She has asthma. She developed a cough with ACE inhibitors. She has no side effects with valsartan , but was intolerant of irbesartan .  Olmesartan  was too strong.  She has a history of calcium  oxalate kidney stones. She also has hyperlipidemia but has been intolerant to multiple statins including daily rosuvastatin , pravastatin and Vytorin. Also did not tolerate Zetia. She seems to tolerate twice weekly rosuvastatin  without complaints.  She does not have known coronary or peripheral arterial obstructive disease.    Past Surgical History:  Procedure Laterality Date   CYSTOSCOPY WITH RETROGRADE PYELOGRAM, URETEROSCOPY AND STENT PLACEMENT Left 10/04/2017   Procedure: CYSTOSCOPY WITH RETROGRADE PYELOGRAM, URETEROSCOPY AND STENT PLACEMENT;  Surgeon: Carolee Sherwood JONETTA DOUGLAS, MD;  Location: [facility];  Service: Urology;  Laterality: Left;   CYSTOSCOPY/URETEROSCOPY/HOLMIUM LASER/STENT PLACEMENT Left 11/12/2022   Procedure: CYSTOSCOPY/URETEROSCOPY/HOLMIUM LASER/STENT PLACEMENT;  Surgeon: Devere Lonni Righter, MD;  Location: [facility];  Service: Urology;  Laterality: Left;   CYSTOSCOPY/URETEROSCOPY/HOLMIUM LASER/STENT PLACEMENT Left 11/21/2024   Procedure: CYSTOSCOPY/URETEROSCOPY/HOLMIUM LASER/STENT PLACEMENT;  Surgeon: Carolee Sherwood JONETTA DOUGLAS, MD;  Location[facility];  Service: Urology;  Laterality: Left;   gyn laparoscopy     LAPAROSCOPIC HYSTERECTOMY     NASAL SEPTUM SURGERY     VESICOVAGINAL FISTULA CLOSURE W/ TAH  1984    Outpatient Medications Prior to Visit  Medication Sig Dispense Refill   acetaminophen  (TYLENOL ) 500 MG tablet Take 500 mg by mouth every 6 (six) hours as needed for moderate pain (pain score 4-6).     albuterol  (  PROVENTIL ) (2.5 MG/3ML) 0.083% nebulizer solution Take 3 mLs (2.5 mg total) by nebulization every 6 (six) hours as needed for wheezing or shortness of breath. 75 mL 12   albuterol  (VENTOLIN  HFA) 108 (90 Base) MCG/ACT inhaler Inhale 2 puffs into the lungs  every 6 (six) hours as needed for wheezing or shortness of breath. 18 g 12   aspirin 81 MG tablet Take 81 mg by mouth daily.     Cholecalciferol (VITAMIN D3) 125 MCG (5000 UT) TABS Take 5,000 Units by mouth 2 (two) times a week.     ciprofloxacin  (CIPRO ) 500 MG tablet Take 1 tablet (500 mg total) by mouth 2 (two) times daily for 7 days. 14 tablet 0   furosemide (LASIX) 40 MG tablet Take 40 mg by mouth daily as needed for edema.     ketorolac  (TORADOL ) 10 MG tablet Take 10 mg by mouth every 8 (eight) hours as needed for moderate pain (pain score 4-6).     latanoprost (XALATAN) 0.005 % ophthalmic solution Place 1 drop into both eyes at bedtime.     levothyroxine (SYNTHROID) 100 MCG tablet Take 100 mcg by mouth daily before breakfast.     LORazepam  (ATIVAN ) 1 MG tablet Take 1 tablet (1 mg total) by mouth 2 (two) times daily as needed for anxiety. (Patient taking differently: Take 1 mg by mouth See admin instructions. Take 1 mg by mouth at bedtime and an additional 1 mg once a day as needed for anxiety) 30 tablet 5   MAGNESIUM GLYCINATE PO Take 500 mg by mouth 2 (two) times daily.     ondansetron  (ZOFRAN -ODT) 4 MG disintegrating tablet Take 1 tablet (4 mg total) by mouth every 8 (eight) hours as needed for up to 12 doses for nausea or vomiting. 12 tablet 0   oxybutynin  (DITROPAN ) 5 MG tablet Take 1 tablet (5 mg total) by mouth every 8 (eight) hours as needed for bladder spasms. 30 tablet 1   Potassium Citrate 15 MEQ (1620 MG) TBCR Take 1 tablet by mouth 2 (two) times daily.     triamcinolone cream (KENALOG) 0.1 % Apply 1 Application topically 3 (three) times a week.     rosuvastatin  (CRESTOR ) 10 MG tablet TAKE 1 TABLET BY MOUTH TWICE  WEEKLY ON TUESDAY AND THURSDAY 24 tablet 0   valsartan  (DIOVAN ) 320 MG tablet TAKE 1 TABLET BY MOUTH DAILY (Patient taking differently: Take 320 mg by mouth at bedtime.) 90 tablet 0   amoxicillin  (AMOXIL ) 500 MG tablet Take 1 tablet (500 mg total) by mouth 2 (two) times  daily. (Patient not taking: Reported on 11/28/2024) 20 tablet 1   traMADol  (ULTRAM ) 50 MG tablet Take 1 tablet (50 mg total) by mouth every 6 (six) hours as needed. (Patient not taking: Reported on 11/28/2024) 20 tablet 0   No facility-administered medications prior to visit.     Allergies:   Erythromycin, Latex, Ace inhibitors, Augmentin [amoxicillin -pot clavulanate], Beta adrenergic blockers, Codeine, Doxycycline , Lisinopril, Other, Oxycodone , Statins, Tape, Amlodipine, Pneumovax 23 [pneumococcal vac polyvalent], Sulfonamide derivatives, and Tramadol    Social History   Socioeconomic History   Marital status: Divorced    Spouse name: Not on file   Number of children: 0   Years of education: COLLEGE   Highest education level: Not on file  Occupational History   Occupation: full time clinical lab tech  Tobacco Use   Smoking status: Never   Smokeless tobacco: Never  Vaping Use   Vaping status: Never Used  Substance and Sexual  Activity   Alcohol use: No   Drug use: No   Sexual activity: Not on file  Other Topics Concern   Not on file  Social History Narrative   Patient does not drink caffeine.   Patient is right handed.   Social Drivers of Health   Tobacco Use: Low Risk (11/28/2024)   Patient History    Smoking Tobacco Use: Never    Smokeless Tobacco Use: Never    Passive Exposure: Not on file  Financial Resource Strain: Not on file  Food Insecurity: Not on file  Transportation Needs: Not on file  Physical Activity: Not on file  Stress: Not on file  Social Connections: Not on file  Depression (EYV7-0): Not on file  Alcohol Screen: Not on file  Housing: Not on file  Utilities: Not on file  Health Literacy: Not on file     Family History:  The patient's family history includes Asthma in her mother; Bladder Cancer in her father; COPD in her father; Cancer in her mother; Diabetes in her maternal grandmother, mother, and sister; Headache in her mother; Heart attack in her  brother and maternal aunt; Heart disease in her brother, maternal grandmother, mother, and sister; Hypertension in her sister; Kidney cancer in her mother and paternal grandfather; Stroke in her father and mother; Tremor in her maternal grandmother, mother, and sister.   ROS:   Please see the history of present illness.    ROS All other systems are reviewed and are negative  PHYSICAL EXAM:   VS:  BP (!) 150/71   Pulse 73   Ht 5' 2 (1.575 m)   Wt 210 lb (95.3 kg)   SpO2 96%   BMI 38.41 kg/m      General: Alert, oriented x3, no distress, severely obese Head: no evidence of trauma, PERRL, EOMI, no exophtalmos or lid lag, no myxedema, no xanthelasma; normal ears, nose and oropharynx Neck: normal jugular venous pulsations and no hepatojugular reflux; brisk carotid pulses without delay and no carotid bruits Chest: clear to auscultation, no signs of consolidation by percussion or palpation, normal fremitus, symmetrical and full respiratory excursions Cardiovascular: normal position and quality of the apical impulse, regular rhythm, normal first and second heart sounds, no murmurs, rubs or gallops Abdomen: no tenderness or distention, no masses by palpation, no abnormal pulsatility or arterial bruits, normal bowel sounds, no hepatosplenomegaly Extremities: no clubbing, cyanosis or edema; 2+ radial, ulnar and brachial pulses bilaterally; 2+ right femoral, posterior tibial and dorsalis pedis pulses; 2+ left femoral, posterior tibial and dorsalis pedis pulses; no subclavian or femoral bruits Neurological: grossly nonfocal Psych: Normal mood and affect    Wt Readings from Last 3 Encounters:  11/28/24 210 lb (95.3 kg)  11/21/24 210 lb 1.6 oz (95.3 kg)  07/25/23 216 lb 3.2 oz (98.1 kg)      Studies/Labs Reviewed:   EKG:  EKG is ordered today.  It shows sinus rhythm with slightly delayed R wave progression.  Recent Labs: June 04, 2019 Total cholesterol 178, triglycerides 140, HDL 50, LDL  100 Creatinine 0.8, potassium 4.4, normal liver function tests, hemoglobin 13.7, TSH 0.51, hemoglobin A1c 5.7%  June 11, 2020 Total cholesterol 159, triglycerides 176, HDL 43, calculated LDL 81 (she was not fasting at the time).   Hemoglobin A1c 5.9%, creatinine 0.8, potassium 4.1, hemoglobin 14.1  June 05, 2021 Total cholesterol 149, HDL 44, LDL 74, triglycerides 154, Potassium 4.0, ALT 35, TSH 3.43 December 22, 2021 Hemoglobin A1c 5.9%  06/29/2023 Cholesterol 174,  triglycerides 168, HDL 43, LDL 97, hemoglobin A1c 6.0% Creatinine 0.8, normal liver function tests, hemoglobin 13.5  07/04/2024 Cholesterol 146, triglycerides 144, HDL 46, LDL 71 hemoglobin J8r 6.0% Creatinine 0.8, potassium 4.3, normal liver function tests, hemoglobin 14.1   ASSESSMENT:    1. Essential hypertension   2. Mixed hyperlipidemia   3. Severe obesity (BMI 35.0-39.9) with comorbidity (HCC)   4. Systolic murmur       PLAN:  In order of problems listed above:  HTN: Systolic blood pressure slightly elevated today, but consistently normal range when checked at home. HLP: All lipid parameters are in target range, improved from last year.  Continue rosuvastatin  twice weekly.   Obesity: Continues to slowly lose weight, unfortunately remains in severely obese range. Murmur: Asymptomatic aortic valve sclerosis.  Repeat an echocardiogram if she develops symptoms.    Medication Adjustments/Labs and Tests Ordered: Current medicines are reviewed at length with the patient today.  Concerns regarding medicines are outlined above.  Medication changes, Labs and Tests ordered today are listed in the Patient Instructions below. Patient Instructions  Medication Instructions:  No changes *If you need a refill on your cardiac medications before your next appointment, please call your pharmacy*  Lab Work: None ordered If you have labs (blood work) drawn today and your tests are completely normal, you will receive your  results only by: MyChart Message (if you have MyChart) OR A paper copy in the mail If you have any lab test that is abnormal or we need to change your treatment, we will call you to review the results.  Testing/Procedures: None ordered  Follow-Up: At [facility], you and your health needs are our priority.  As part of our continuing mission to provide you with exceptional heart care, our providers are all part of one team.  This team includes your primary Cardiologist (physician) and Advanced Practice Providers or APPs (Physician Assistants and Nurse Practitioners) who all work together to provide you with the care you need, when you need it.  Your next appointment:   1 year(s)  Provider:   Jerel Balding, MD    We recommend signing up for the patient portal called MyChart.  Sign up information is provided on this After Visit Summary.  MyChart is used to connect with patients for Virtual Visits (Telemedicine).  Patients are able to view lab/test results, encounter notes, upcoming appointments, etc.  Non-urgent messages can be sent to your provider as well.   To learn more about what you can do with MyChart, go to [email or web address].        Signed, Jerel Balding, MD  12/07/2024 12:55 PM    [facility] [address] [place], [place], [place]  [address] Phone: [phone]; Fax: [phone]

## 2024-12-02 ENCOUNTER — Other Ambulatory Visit: Payer: Self-pay | Admitting: Cardiovascular Disease
